# Patient Record
Sex: Female | Born: 1965 | Race: Asian | Hispanic: No | Marital: Married | Smoking: Never smoker
Health system: Southern US, Community
[De-identification: ages and names within clinical notes are randomized; demographics above are authoritative.]

## PROBLEM LIST (undated history)

## (undated) DIAGNOSIS — J869 Pyothorax without fistula: Secondary | ICD-10-CM

## (undated) HISTORY — DX: Pyothorax without fistula: J86.9

---

## 1999-12-02 ENCOUNTER — Ambulatory Visit (HOSPITAL_COMMUNITY): Admission: RE | Admit: 1999-12-02 | Discharge: 1999-12-02 | Payer: Self-pay | Admitting: Family Medicine

## 1999-12-02 ENCOUNTER — Encounter: Payer: Self-pay | Admitting: Family Medicine

## 2014-01-13 ENCOUNTER — Ambulatory Visit (INDEPENDENT_AMBULATORY_CARE_PROVIDER_SITE_OTHER): Payer: BC Managed Care – PPO | Admitting: Internal Medicine

## 2014-01-13 VITALS — BP 122/80 | HR 67 | Temp 100.7°F | Resp 16 | Ht <= 58 in | Wt 115.0 lb

## 2014-01-13 DIAGNOSIS — Z23 Encounter for immunization: Secondary | ICD-10-CM

## 2014-01-13 DIAGNOSIS — R8281 Pyuria: Secondary | ICD-10-CM

## 2014-01-13 DIAGNOSIS — Z0000 Encounter for general adult medical examination without abnormal findings: Secondary | ICD-10-CM

## 2014-01-13 DIAGNOSIS — N390 Urinary tract infection, site not specified: Secondary | ICD-10-CM

## 2014-01-13 LAB — LIPID PANEL
Cholesterol: 187 mg/dL (ref 0–200)
HDL: 65 mg/dL (ref >39)
LDL Cholesterol: 109 mg/dL — ABNORMAL HIGH (ref 0–99)
Total CHOL/HDL Ratio: 2.9 Ratio
Triglycerides: 64 mg/dL (ref <150)
VLDL: 13 mg/dL (ref 0–40)

## 2014-01-13 LAB — POCT WET PREP WITH KOH
Clue Cells Wet Prep HPF POC: NEGATIVE
KOH Prep POC: NEGATIVE
RBC Wet Prep HPF POC: NEGATIVE
Trichomonas, UA: NEGATIVE
Yeast Wet Prep HPF POC: NEGATIVE

## 2014-01-13 LAB — POCT CBC
Granulocyte percent: 47.4 %G (ref 37–80)
HCT, POC: 41.5 % (ref 37.7–47.9)
Hemoglobin: 13.4 g/dL (ref 12.2–16.2)
Lymph, poc: 2.6 (ref 0.6–3.4)
MCH, POC: 29.2 pg (ref 27–31.2)
MCHC: 32.3 g/dL (ref 31.8–35.4)
MCV: 90.4 fL (ref 80–97)
MID (cbc): 0.5 (ref 0–0.9)
MPV: 5.5 fL (ref 0–99.8)
POC Granulocyte: 2.8 (ref 2–6.9)
POC LYMPH PERCENT: 44.9 %L (ref 10–50)
POC MID %: 7.7 %M (ref 0–12)
Platelet Count, POC: 298 10*3/uL (ref 142–424)
RBC: 4.59 M/uL (ref 4.04–5.48)
RDW, POC: 13.7 %
WBC: 5.9 10*3/uL (ref 4.6–10.2)

## 2014-01-13 LAB — COMPLETE METABOLIC PANEL WITH GFR
ALT: 9 U/L (ref 0–35)
AST: 10 U/L (ref 0–37)
Albumin: 4.1 g/dL (ref 3.5–5.2)
Alkaline Phosphatase: 56 U/L (ref 39–117)
BUN: 15 mg/dL (ref 6–23)
CO2: 28 mEq/L (ref 19–32)
Calcium: 8.9 mg/dL (ref 8.4–10.5)
Chloride: 104 mEq/L (ref 96–112)
Creat: 0.57 mg/dL (ref 0.50–1.10)
GFR, Est African American: >89 mL/min
GFR, Est Non African American: >89 mL/min
Glucose, Bld: 85 mg/dL (ref 70–99)
Potassium: 3.8 mEq/L (ref 3.5–5.3)
Sodium: 139 mEq/L (ref 135–145)
Total Bilirubin: 0.7 mg/dL (ref 0.2–1.2)
Total Protein: 7 g/dL (ref 6.0–8.3)

## 2014-01-13 LAB — POCT URINALYSIS DIPSTICK
Bilirubin, UA: NEGATIVE
Blood, UA: NEGATIVE
Glucose, UA: NEGATIVE
Ketones, UA: NEGATIVE
Nitrite, UA: NEGATIVE
Protein, UA: NEGATIVE
Spec Grav, UA: 1.02
Urobilinogen, UA: 0.2
pH, UA: 6.5

## 2014-01-13 LAB — POCT UA - MICROSCOPIC ONLY
Casts, Ur, LPF, POC: NEGATIVE
Crystals, Ur, HPF, POC: NEGATIVE
Mucus, UA: NEGATIVE
RBC, urine, microscopic: NEGATIVE
Yeast, UA: NEGATIVE

## 2014-01-13 LAB — POCT URINE PREGNANCY: Preg Test, Ur: NEGATIVE

## 2014-01-13 NOTE — Progress Notes (Signed)
Subjective:    Patient ID: Sara Li, female    DOB: 10/13/1965, 48 y.o.   MRN: [id number] This chart was scribed for Ellamae Siaobert Jory Welke, MD by Jolene Provostobert Halas, Medical Scribe. This patient was seen in Room 8 and the patient's care was started a 9:16 AM. Translator present-family member.   HPI HPI Comments: Sara Li is a 48 y.o. female who [facility].  On triage pt had a measured fever of 100.7. Pt endorses associated HA. Pt denies dysuria, cough, or sore throat.  Pt denies family hx of breast cancer. Never mammo.Patient's last menstrual period was 11/29/2013. 3d lateo hx of pap smear on chart.  Pt would like a pregnancy test.LMP 3 d late  immun thoght to be UTD  Review of Systems  Constitutional: Positive for fever.  All other systems reviewed and are negative. see form     Objective:   Physical Exam  Constitutional: She is oriented to person, place, and time. She appears well-developed and well-nourished. No distress.  HENT:  Head: Normocephalic and atraumatic.  Right Ear: External ear normal.  Left Ear: External ear normal.  Nose: Nose normal.  Mouth/Throat: Oropharynx is clear and moist.  Eyes: Conjunctivae and EOM are normal. Pupils are equal, round, and reactive to light.  Neck: Normal range of motion. Neck supple. No thyromegaly present.  Cardiovascular: Normal rate, regular rhythm, normal heart sounds and intact distal pulses.   No murmur heard. Pulmonary/Chest: Effort normal and breath sounds normal. No respiratory distress. She has no wheezes.  Abdominal: Soft. Bowel sounds are normal. She exhibits no distension and no mass. There is no hepatosplenomegaly. There is no tenderness. There is no rebound. Hernia confirmed negative in the right inguinal area and confirmed negative in the left inguinal area.  Genitourinary: Uterus normal. There is no rash on the right labia. There is no rash on the left labia. Uterus is not tender. Cervix exhibits  discharge. Cervix exhibits no friability. Right adnexum displays no mass, no tenderness and no fullness. Left adnexum displays no mass, no tenderness and no fullness. No erythema, tenderness or bleeding in the vagina. Vaginal discharge found.  Breast exam normal.  Musculoskeletal: Normal range of motion. She exhibits no edema or tenderness.  Lymphadenopathy:    She has no cervical adenopathy.       Right: No inguinal adenopathy present.       Left: No inguinal adenopathy present.  Neurological: She is alert and oriented to person, place, and time. She has normal reflexes. No cranial nerve deficit.  Skin: Skin is warm and dry. No rash noted.  Psychiatric: She has a normal mood and affect. Her behavior is normal. Judgment and thought content normal.  Nursing note and vitals reviewed.  Results for orders placed or performed in visit on 01/13/14  COMPLETE METABOLIC PANEL WITH GFR  Result Value Ref Range   Sodium 139 135 - 145 mEq/L   Potassium 3.8 3.5 - 5.3 mEq/L   Chloride 104 96 - 112 mEq/L   CO2 28 19 - 32 mEq/L   Glucose, Bld 85 70 - 99 mg/dL   BUN 15 6 - 23 mg/dL   Creat 4.090.57 8.110.50 - 9.141.10 mg/dL   Total Bilirubin 0.7 0.2 - 1.2 mg/dL   Alkaline Phosphatase 56 39 - 117 U/L   AST 10 0 - 37 U/L   ALT 9 0 - 35 U/L   Total Protein 7.0 6.0 - 8.3 g/dL   Albumin  4.1 3.5 - 5.2 g/dL   Calcium 8.9 8.4 - 40.910.5 mg/dL   GFR, Est African American >89 mL/min   GFR, Est Non African American >89 mL/min  Lipid panel  Result Value Ref Range   Cholesterol 187 0 - 200 mg/dL   Triglycerides 64 <811<150 mg/dL   HDL 65 >91>39 mg/dL   Total CHOL/HDL Ratio 2.9 Ratio   VLDL 13 0 - 40 mg/dL   LDL Cholesterol 478109 (H) 0 - 99 mg/dL  POCT CBC  Result Value Ref Range   WBC 5.9 4.6 - 10.2 K/uL   Lymph, poc 2.6 0.6 - 3.4   POC LYMPH PERCENT 44.9 10 - 50 %L   MID (cbc) 0.5 0 - 0.9   POC MID % 7.7 0 - 12 %M   POC Granulocyte 2.8 2 - 6.9   Granulocyte percent 47.4 37 - 80 %G   RBC 4.59 4.04 - 5.48 M/uL   Hemoglobin  13.4 12.2 - 16.2 g/dL   HCT, POC 29.541.5 62.137.7 - 47.9 %   MCV 90.4 80 - 97 fL   MCH, POC 29.2 27 - 31.2 pg   MCHC 32.3 31.8 - 35.4 g/dL   RDW, POC 30.813.7 %   Platelet Count, POC 298 142 - 424 K/uL   MPV 5.5 0 - 99.8 fL  POCT UA - Microscopic Only  Result Value Ref Range   WBC, Ur, HPF, POC 6-15    RBC, urine, microscopic neg    Bacteria, U Microscopic trace    Mucus, UA neg    Epithelial cells, urine per micros TNTC    Crystals, Ur, HPF, POC neg    Casts, Ur, LPF, POC neg    Yeast, UA neg   POCT urinalysis dipstick  Result Value Ref Range   Color, UA yellow    Clarity, UA clear    Glucose, UA neg    Bilirubin, UA neg    Ketones, UA neg    Spec Grav, UA 1.020    Blood, UA neg    pH, UA 6.5    Protein, UA neg    Urobilinogen, UA 0.2    Nitrite, UA neg    Leukocytes, UA small (1+)   POCT urine pregnancy  Result Value Ref Range   Preg Test, Ur Negative   POCT Wet Prep with KOH  Result Value Ref Range   Trichomonas, UA Negative    Clue Cells Wet Prep HPF POC neg    Epithelial Wet Prep HPF POC 0-6    Yeast Wet Prep HPF POC neg    Bacteria Wet Prep HPF POC 1+    RBC Wet Prep HPF POC neg    WBC Wet Prep HPF POC 20-30    KOH Prep POC Negative         Assessment & Plan:   I have completed the patient encounter in its entirety as documented by the scribe, with editing by me where necessary. Lum Stillinger P. Merla Richesoolittle, M.D.   Annual physical exam - Plan: Pap IG, CT/NG NAA, and HPV (high risk),   MM Digital Screening/rout labs  Pyuria - Plan: Urine culture  Need for prophylactic vaccination and inoculation against influenza - Plan: Flu Vaccine QUAD 36+ mos IM   Call w/ results

## 2014-01-14 LAB — URINE CULTURE
Colony Count: NO GROWTH
Organism ID, Bacteria: NO GROWTH

## 2014-01-15 LAB — PAP IG, CT-NG NAA, HPV HIGH-RISK
Chlamydia Probe Amp: NEGATIVE
GC Probe Amp: NEGATIVE
HPV DNA High Risk: NOT DETECTED

## 2014-01-21 ENCOUNTER — Other Ambulatory Visit: Payer: Self-pay | Admitting: Internal Medicine

## 2014-01-21 DIAGNOSIS — Z1231 Encounter for screening mammogram for malignant neoplasm of breast: Secondary | ICD-10-CM

## 2015-04-06 ENCOUNTER — Ambulatory Visit (INDEPENDENT_AMBULATORY_CARE_PROVIDER_SITE_OTHER): Payer: BLUE CROSS/BLUE SHIELD | Admitting: Family Medicine

## 2015-04-06 ENCOUNTER — Ambulatory Visit (INDEPENDENT_AMBULATORY_CARE_PROVIDER_SITE_OTHER): Payer: BLUE CROSS/BLUE SHIELD

## 2015-04-06 ENCOUNTER — Ambulatory Visit (HOSPITAL_BASED_OUTPATIENT_CLINIC_OR_DEPARTMENT_OTHER)
Admission: RE | Admit: 2015-04-06 | Discharge: 2015-04-06 | Disposition: A | Discharge status: Home / Self Care | Payer: BLUE CROSS/BLUE SHIELD | Source: Ambulatory Visit | Attending: Physician Assistant | Admitting: Physician Assistant

## 2015-04-06 ENCOUNTER — Encounter (HOSPITAL_BASED_OUTPATIENT_CLINIC_OR_DEPARTMENT_OTHER): Payer: Self-pay | Admitting: *Deleted

## 2015-04-06 ENCOUNTER — Emergency Department (HOSPITAL_BASED_OUTPATIENT_CLINIC_OR_DEPARTMENT_OTHER)
Admission: EM | Admit: 2015-04-06 | Discharge: 2015-04-06 | Disposition: A | Discharge status: Home / Self Care | Payer: BLUE CROSS/BLUE SHIELD | Attending: Emergency Medicine | Admitting: Emergency Medicine

## 2015-04-06 ENCOUNTER — Encounter (HOSPITAL_BASED_OUTPATIENT_CLINIC_OR_DEPARTMENT_OTHER): Payer: Self-pay

## 2015-04-06 ENCOUNTER — Other Ambulatory Visit: Payer: Self-pay

## 2015-04-06 VITALS — BP 122/78 | HR 90 | Temp 99.1°F | Resp 16 | Ht <= 58 in | Wt 115.0 lb

## 2015-04-06 DIAGNOSIS — J90 Pleural effusion, not elsewhere classified: Secondary | ICD-10-CM

## 2015-04-06 DIAGNOSIS — R079 Chest pain, unspecified: Secondary | ICD-10-CM

## 2015-04-06 DIAGNOSIS — M549 Dorsalgia, unspecified: Secondary | ICD-10-CM | POA: Diagnosis not present

## 2015-04-06 DIAGNOSIS — J069 Acute upper respiratory infection, unspecified: Secondary | ICD-10-CM | POA: Insufficient documentation

## 2015-04-06 DIAGNOSIS — J9811 Atelectasis: Secondary | ICD-10-CM | POA: Insufficient documentation

## 2015-04-06 DIAGNOSIS — Z3202 Encounter for pregnancy test, result negative: Secondary | ICD-10-CM

## 2015-04-06 LAB — CBC
HEMATOCRIT: 40.9 % (ref 36.0–46.0)
HEMOGLOBIN: 13.1 g/dL (ref 12.0–15.0)
MCH: 28.1 pg (ref 26.0–34.0)
MCHC: 32 g/dL (ref 30.0–36.0)
MCV: 87.8 fL (ref 78.0–100.0)
Platelets: 402 10*3/uL — ABNORMAL HIGH (ref 150–400)
RBC: 4.66 MIL/uL (ref 3.87–5.11)
RDW: 12.5 % (ref 11.5–15.5)
WBC: 13 10*3/uL — ABNORMAL HIGH (ref 4.0–10.5)

## 2015-04-06 LAB — BASIC METABOLIC PANEL
Anion gap: 10 (ref 5–15)
BUN: 15 mg/dL (ref 6–20)
CO2: 25 mmol/L (ref 22–32)
Calcium: 8.8 mg/dL — ABNORMAL LOW (ref 8.9–10.3)
Chloride: 102 mmol/L (ref 101–111)
Creatinine, Ser: 0.55 mg/dL (ref 0.44–1.00)
GFR calc Af Amer: >60 mL/min (ref >60)
GLUCOSE: 108 mg/dL — AB (ref 65–99)
POTASSIUM: 3.9 mmol/L (ref 3.5–5.1)
Sodium: 137 mmol/L (ref 135–145)

## 2015-04-06 LAB — TROPONIN I: Troponin I: <0.03 ng/mL (ref <0.031)

## 2015-04-06 LAB — POCT URINE PREGNANCY: Preg Test, Ur: NEGATIVE

## 2015-04-06 MED ORDER — KETOROLAC TROMETHAMINE 30 MG/ML IJ SOLN
30.0000 mg | Freq: Once | INTRAMUSCULAR | Status: AC
  Administered 2015-04-06: 30 mg via INTRAMUSCULAR
  Filled 2015-04-06: qty 1

## 2015-04-06 MED ORDER — IOHEXOL 350 MG/ML SOLN
100.0000 mL | Freq: Once | INTRAVENOUS | Status: AC | PRN
  Administered 2015-04-06: 100 mL via INTRAVENOUS

## 2015-04-06 MED ORDER — TRAMADOL HCL 50 MG PO TABS: 50.0000 mg | ORAL_TABLET | Freq: Four times a day (QID) | ORAL | Status: DC | PRN

## 2015-04-06 NOTE — ED Provider Notes (Signed)
CSN: [id number]     Arrival date & time 04/06/15  1608 History  By signing my name below, I, Sara Li, attest that this documentation has been prepared under the direction and in the presence of Benjiman Core, MD. Electronically Signed: Bethel Li, ED Scribe. 04/06/2015. 5:21 PM   Chief Complaint  Patient presents with  . Chest Pain   The history is provided by the patient. A language interpreter was used.   Sara Li is a 50 y.o. female who presents to the Emergency Department complaining of constant, worsening, 10/10 in severity, right upper back pain that radiates into the chest with onset 10 days ago. The pain is exacerbated by breathing. Today during an outpatient CT scan the pain under her right breast significantly worsened.  Associated symptoms include chills. Pt denies fever, cough, hemoptysis, and weight loss. She has no PCP. Pt denies smoking.   A relative is at bedside translating.    History reviewed. No pertinent past medical history. History reviewed. No pertinent past surgical history. History reviewed. No pertinent family history. Social History  Substance Use Topics  . Smoking status: Never Smoker   . Smokeless tobacco: None  . Alcohol Use: No   OB History    No data available     Review of Systems  Constitutional: Positive for chills. Negative for fever.  Respiratory: Negative for cough.   Cardiovascular: Positive for chest pain.  Musculoskeletal: Positive for back pain.  All other systems reviewed and are negative.  Allergies  Review of patient's allergies indicates no known allergies.  Home Medications   Prior to Admission medications   Medication Sig Start Date End Date Taking? Authorizing Provider  traMADol (ULTRAM) 50 MG tablet Take 1 tablet (50 mg total) by mouth every 6 (six) hours as needed. 04/06/15   Benjiman Core, MD   BP 124/84 mmHg  Pulse 108  Temp(Src) 99.9 F (37.7 C) (Oral)  Resp 18  Ht  (1.549 m)  Wt 116 lb  (52.617 kg)  BMI 21.93 kg/m2  SpO2 98%  LMP 04/06/2015 Physical Exam  Constitutional: She is oriented to person, place, and time. She appears well-developed and well-nourished. No distress.  Pt appears uncomfortable  HENT:  Head: Normocephalic and atraumatic.  Eyes: EOM are normal.  Neck: Normal range of motion.  Cardiovascular: Normal rate, regular rhythm and normal heart sounds.   Pulmonary/Chest: Effort normal and breath sounds normal.  Abdominal: Soft. She exhibits no distension. There is no tenderness.  Musculoskeletal: Normal range of motion.  Neurological: She is alert and oriented to person, place, and time.  Skin: Skin is warm and dry.  Psychiatric: She has a normal mood and affect. Judgment normal.  Nursing note and vitals reviewed.   ED Course  Procedures (including critical care time) COORDINATION OF CARE: 5:13 PM Discussed treatment plan which includes lab work and pain management with pt at bedside and pt agreed to plan.  Labs Review Labs Reviewed  BASIC METABOLIC PANEL - Abnormal; Notable for the following:    Glucose, Bld 108 (*)    Calcium 8.8 (*)    All other components within normal limits  CBC - Abnormal; Notable for the following:    WBC 13.0 (*)    Platelets 402 (*)    All other components within normal limits  TROPONIN I    Imaging Review Dg Chest 2 View  04/06/2015  CLINICAL DATA:  50 year old female with atypical right side chest and mid thoracic back pain with  illness for 10 days. Initial encounter. EXAM: CHEST  2 VIEW COMPARISON:  None. FINDINGS: Small to moderate bilateral pleural effusions, small to moderate right pleural effusion. Dense associated right lung base opacification. Mildly low lung volumes superimposed, mild crowding at the left lung base which otherwise is clear. No pneumothorax or pleural effusion. Normal cardiac size and mediastinal contours. Visualized tracheal air column is within normal limits. No osseous abnormality identified.  IMPRESSION: Small to moderate right pleural effusion. No other acute cardiopulmonary abnormality. Electronically Signed   By: Odessa Fleming M.D.   On: 04/06/2015 13:23   Ct Angio Chest W/cm &/or Wo Cm  04/06/2015  CLINICAL DATA:  Chest pain.  Rule out pulmonary embolism EXAM: CT ANGIOGRAPHY CHEST WITH CONTRAST TECHNIQUE: Multidetector CT imaging of the chest was performed using the standard protocol during bolus administration of intravenous contrast. Multiplanar CT image reconstructions and MIPs were obtained to evaluate the vascular anatomy. CONTRAST:  OMNIPAQUE IOHEXOL 350 MG/ML SOLN COMPARISON:  None. FINDINGS: Negative for pulmonary embolism. Pulmonary arteries normal in caliber. Heart size normal. No pericardial effusion. Thoracic aorta appears normal. No dissection or aneurysm. Elevated right hemidiaphragm. Small right pleural effusion. Right lower lobe atelectasis. No effusion on the left. No significant infiltrate on the left. Negative for mass or adenopathy. Elevated right hemidiaphragm with fatty infiltration of the liver. No acute abnormality upper abdomen. Review of the MIP images confirms the above findings. IMPRESSION: Negative for pulmonary embolism Small right effusion and dependent atelectasis right lower lobe with elevation of the right hemidiaphragm. Electronically Signed   By: Marlan Palau M.D.   On: 04/06/2015 16:22   I have personally reviewed and evaluated these images and lab results as part of my medical decision-making.   EKG Interpretation None      Date: 04/07/2015  Rate: 99  Rhythm: normal sinus rhythm  QRS Axis: normal  Intervals: normal  ST/T Wave abnormalities: normal  Conduction Disutrbances: none  Narrative Interpretation: unremarkable    MDM   Final diagnoses:  URI (upper respiratory infection)  Pleural effusion    Patient with chest pain. CT scan [place] and reassuring. Did have pleural effusion. Will need follow-up to assure clearance and no  underlying malignancy. No PE. Doubt cardiac cause. Will discharge home.  I personally performed the services described in this documentation, which was scribed in my presence. The recorded information has been reviewed and is accurate.      Benjiman Core, MD 04/07/15 [phone]

## 2015-04-06 NOTE — Progress Notes (Signed)
CT Angio chest ordered per Dr. Loma Boston VO.  Deliah Boston, MS, PA-C 1:24 PM, 04/06/2015

## 2015-04-06 NOTE — Progress Notes (Addendum)
By signing my name below, I, Stann Ore, attest that this documentation has been prepared under the direction and in the presence of Elvina Sidle, MD. Electronically Signed: Stann Ore, Scribe. 04/06/2015 , 12:53 PM .  Patient was seen in room 11 .   Patient ID: Sara Li MRN: [id number], DOB: September 28, 1965, 50 y.o. Date of Encounter: 04/06/2015  Primary Physician: No PCP Per Patient  Chief Complaint:  Chief Complaint  Patient presents with  . Right side and upper back pain x 10 days    especially with deep breaths    HPI:  Sara Li is a 50 y.o. female who presents to [facility] complaining of right upper back pain that started 10 days ago. Pt states that she had a cold with fever last week. She took a shower and felt pain in her right upper back. She notes that it hurts more in the evening. She's taken tylenol for some relief. She denies pain in her calves.   Patient primarily speaks [place] Her friend is here to translate for her in the room.  She does packing work.   No past medical history on file.   Home Meds: Prior to Admission medications   Not on File    Allergies: No Known Allergies  Social History   Social History  . Marital Status: Married    Spouse Name: N/A  . Number of Children: N/A  . Years of Education: N/A   Occupational History  . Not on file.   Social History Main Topics  . Smoking status: Never Smoker   . Smokeless tobacco: Not on file  . Alcohol Use: No  . Drug Use: No  . Sexual Activity: Not on file   Other Topics Concern  . Not on file   Social History Narrative     Review of Systems: Constitutional: negative for fever, chills, night sweats, weight changes, or fatigue  HEENT: negative for vision changes, hearing loss, congestion, rhinorrhea, ST, epistaxis, or sinus pressure Cardiovascular: negative for chest pain or palpitations Respiratory: negative for hemoptysis, wheezing, shortness of breath,  or cough Abdominal: negative for abdominal pain, nausea, vomiting, diarrhea, or constipation Dermatological: negative for rash Musc: positive for back pain (right upper)  Neurologic: negative for headache, dizziness, or syncope All other systems reviewed and are otherwise negative with the exception to those above and in the HPI.  Physical Exam: Blood pressure 122/78, pulse 90, temperature 99.1 F (37.3 C), temperature source Oral, resp. rate 16, height  (1.473 m), weight 115 lb (52.164 kg), SpO2 94 %., Body mass index is 24.04 kg/(m^2). General: Well developed, well nourished, in no acute distress. Head: Normocephalic, atraumatic, eyes without discharge, sclera non-icteric, nares are without discharge. Bilateral auditory canals clear, TM's are without perforation, pearly grey and translucent with reflective cone of light bilaterally. Oral cavity moist, posterior pharynx without exudate, erythema, peritonsillar abscess, or post nasal drip.  Neck: Supple. No thyromegaly. Full ROM. No lymphadenopathy. Lungs: Clear bilaterally to auscultation without wheezes, rales, or rhonchi. Breathing is unlabored. Heart: RRR with S1 S2. No murmurs, rubs, or gallops appreciated. Msk:  Strength and tone normal for age. Extremities/Skin: Warm and dry. No clubbing or cyanosis. No edema. No rashes or suspicious lesions. Neuro: Alert and oriented X 3. Moves all extremities spontaneously. Gait is normal. CNII-XII grossly in tact. Psych:  Responds to questions appropriately with a normal affect.   [facility] reading (PRIMARY) by Dr. Milus Glazier : chest xray: what appears to  be a fluid in the right lung (pleural effusion)  Labs: Results for orders placed or performed in visit on 01/13/14  Urine culture  Result Value Ref Range   Colony Count NO GROWTH    Organism ID, Bacteria NO GROWTH   COMPLETE METABOLIC PANEL WITH GFR  Result Value Ref Range   Sodium 139 135 - 145 mEq/L   Potassium 3.8 3.5 - 5.3 mEq/L    Chloride 104 96 - 112 mEq/L   CO2 28 19 - 32 mEq/L   Glucose, Bld 85 70 - 99 mg/dL   BUN 15 6 - 23 mg/dL   Creat 1.61 0.96 - 0.45 mg/dL   Total Bilirubin 0.7 0.2 - 1.2 mg/dL   Alkaline Phosphatase 56 39 - 117 U/L   AST 10 0 - 37 U/L   ALT 9 0 - 35 U/L   Total Protein 7.0 6.0 - 8.3 g/dL   Albumin 4.1 3.5 - 5.2 g/dL   Calcium 8.9 8.4 - 40.9 mg/dL   GFR, Est African American >89 mL/min   GFR, Est Non African American >89 mL/min  Lipid panel  Result Value Ref Range   Cholesterol 187 0 - 200 mg/dL   Triglycerides 64 <811 mg/dL   HDL 65 >91 mg/dL   Total CHOL/HDL Ratio 2.9 Ratio   VLDL 13 0 - 40 mg/dL   LDL Cholesterol 478 (H) 0 - 99 mg/dL  POCT CBC  Result Value Ref Range   WBC 5.9 4.6 - 10.2 K/uL   Lymph, poc 2.6 0.6 - 3.4   POC LYMPH PERCENT 44.9 10 - 50 %L   MID (cbc) 0.5 0 - 0.9   POC MID % 7.7 0 - 12 %M   POC Granulocyte 2.8 2 - 6.9   Granulocyte percent 47.4 37 - 80 %G   RBC 4.59 4.04 - 5.48 M/uL   Hemoglobin 13.4 12.2 - 16.2 g/dL   HCT, POC 29.5 62.1 - 47.9 %   MCV 90.4 80 - 97 fL   MCH, POC 29.2 27 - 31.2 pg   MCHC 32.3 31.8 - 35.4 g/dL   RDW, POC 30.8 %   Platelet Count, POC 298 142 - 424 K/uL   MPV 5.5 0 - 99.8 fL  POCT UA - Microscopic Only  Result Value Ref Range   WBC, Ur, HPF, POC 6-15    RBC, urine, microscopic neg    Bacteria, U Microscopic trace    Mucus, UA neg    Epithelial cells, urine per micros TNTC    Crystals, Ur, HPF, POC neg    Casts, Ur, LPF, POC neg    Yeast, UA neg   POCT urinalysis dipstick  Result Value Ref Range   Color, UA yellow    Clarity, UA clear    Glucose, UA neg    Bilirubin, UA neg    Ketones, UA neg    Spec Grav, UA 1.020    Blood, UA neg    pH, UA 6.5    Protein, UA neg    Urobilinogen, UA 0.2    Nitrite, UA neg    Leukocytes, UA small (1+)   POCT urine pregnancy  Result Value Ref Range   Preg Test, Ur Negative   POCT Wet Prep with KOH  Result Value Ref Range   Trichomonas, UA Negative    Clue Cells Wet Prep  HPF POC neg    Epithelial Wet Prep HPF POC 0-6    Yeast Wet Prep HPF POC neg  Bacteria Wet Prep HPF POC 1+    RBC Wet Prep HPF POC neg    WBC Wet Prep HPF POC 20-30    KOH Prep POC Negative   Pap IG, CT/NG NAA, and HPV (high risk)  Result Value Ref Range   HPV DNA High Risk Not Detected    Specimen adequacy: SEE NOTE    FINAL DIAGNOSIS: SEE NOTE    Cytotechnologist: SEE NOTE    Chlamydia Probe Amp NEGATIVE    GC Probe Amp NEGATIVE      ASSESSMENT AND PLAN:  50 y.o. year old female with significant new pleural effusion with atypical chest pain.  We will proceed with a CT angiogram of the chest. I explained this to the patient and we prefer to have [facility] go to [facility] to get her in and out quickly    Signed, Elvina Sidle, MD 04/06/2015 1:15 PM    Patient developed excruciating pain before I received the CT report. I directed them to the emergency department at [facility]. Prograf signed, Sheila Oats.D.

## 2015-04-06 NOTE — ED Notes (Addendum)
Left upper flank pain radiating into the back that started 10 days ago.  Pt went to urgent care to be evaluated, was sent to [facility] for an outpatient CT scan.  After pt got contrast dye for CT, pt started having pain under her right breast.  Denies N/V.

## 2015-04-06 NOTE — Discharge Instructions (Signed)
Nhi?m trng ???ng h h?p trn, Ng??i l?n (Upper Respiratory Infection, Adult) H?u h?t nhi?m trng ???ng h h?p trn (URI) l b?nh nhi?m do vi rt ? ???ng d?n kh ??n ph?i. URI ?nh h??ng ??n m?i, h?ng v ???ng d?n kh trn. Lo?i URI ph? bi?n nh?t l vim m?i h?ng v th??ng ???c cho l "c?m l?nh thng th??ng". URI ti?n tri?n v th??ng s? t? kh?i. Thng th??ng URI khng c?n ?i?u tr?, nh?ng ?i khi ti?p theo nhi?m vi rt l nhi?m khu?n ???ng h h?p trn. Tnh tr?ng ny g?i l nhi?m trng th? pht. Nhi?m trng xoang v tai gi?a l nh?ng lo?i nhi?m trng h h?p trn th? pht ph? bi?n nh?t. Vim ph?i do vi khu?n c?ng c th? lm cho URI tr?m tr?ng h?n. URI c th? lm cho b?nh hen suy?n v b?nh ph?i t?c ngh?n m?n tnh (COPD) tr?m tr?ng h?n. ?i khi, nh?ng bi?n ch?ng ny c th? c?n ph?i ???c ?i?u tr? c?p c?u n?i khoa v c th? ?e d?a tnh m?ng.  NGUYN NHN H?u h?t t?t c? cc tr??ng h?p URI ??u do vi rt gy ra. Vi rt l m?t lo?i m?m b?nh v c th? ly lan t? ng??i ny sang ng??i khc.  CC Y?U T? NGUY C? Qu v? c th? c nguy c? b? URI n?u:   Qu v? ht thu?c l.  Qu v? b? b?nh tim ho?c b?nh ph?i m?n tnh.  Qu v? c h? th?ng phng v? (mi?n d?ch) suy y?u.  Qu v? r?t tr? ho?c r?t gi.  Qu v? b? d? ?ng ? m?i ho?c b? hen suy?n.  Qu v? lm vi?c trong cc khu v?c ?ng ?c ho?c thng kh km.  Qu v? lm vi?c trong cc trung tm ch?m [place] s?c kh?e ho?c tr??ng h?c. D?U HI?U V TRI?U CH?NG  Cc tri?u ch?ng th??ng pht tri?n sau khi qu v? ti?p xc v?i vi rt c?m l?nh 2-3 ngy. H?u h?t cc tr??ng h?p URI do vi rt ko di 7-10 ngy. Tuy nhin, cc tr??ng h?p URI do vi rt cm (vi rt cm) c th? ko di 14 - 18 ngy v th??ng n?ng h?n. Tri?u ch?ng c th? bao g?m:   Ch?y n??c m?i ho?c ng?t (ngh?t) m?i.  H?t h?i.  Ho.  ?au h?ng.  ?au ??u.  M?t m?i.  S?t.  ?n khng ngon mi?ng.  ?au ? trn, pha sau m?t v trn x??ng g m (?au xoang).  ?au nh?c c?. CH?N ?ON  Chuyn gia ch?m [place] s?c kh?e  c th? ch?n ?on URI b?ng cch:  Khm th?c th?.  Ki?m tra ?? kh?ng ??nh cc tri?u ch?ng khng ph?i l do m?t tnh tr?ng khc, ch?ng h?n nh?:  Vim h?ng do lin c?u khu?n.  Vim xoang.  Vim ph?i.  Hen suy?n. ?I?U TR?  URI t? kh?i sau m?t th?i gian. B?nh khng th? ch?a kh?i ???c b?ng thu?c, nh?ng thu?c c th? ???c k ??n v khuy?n ngh? dng ?? lm gi?m cc tri?u ch?ng. Thu?c c th? gip:  H? s?t.  Gi?m ho.  Gi?m ngh?t m?i. H??NG D?N CH?M [place] T?I NH   Ch? s? d?ng thu?c theo ch? d?n c?a chuyn gia ch?m [place] s?c kh?e.  Xc mi?ng b?ng n??c mu?i ?m ho?c dng thu?c gi?m ho ?? lm d?u h?ng qu v? theo ch? d?n c?a chuyn gia ch?m  s?c kh?e.  S? d?ng my t?o ?? ?m b?ng s??ng m ?m ho?c ht h?i n??c t? m?t vi  sen ?? t?ng ?? ?m khng kh. ?i?u ny c th? gip d? th? h?n.  U?ng ?? n??c ?? gi? cho n??c ti?u trong ho?c c mu vng nh?t.  ?n sp ho?c cc lo?i n??c canh trong khc v duy tr ch? ?? dinh d??ng t?t.  Ngh? ng?i khi c?n.  Tr? l?i lm vi?c khi nhi?t ?? c?a qu v? ? tr? l?i bnh th??ng ho?c theo chuyn gia ch?m [place] s?c kh?e h??ng d?n. Qu v? c th? c?n ? nh lu h?n ?? trnh ly nhi?m cho ng??i khc. Qu v? c?ng c th? s? d?ng m?t n? v r?a tay c?n th?n ?? ng?n ng?a ly lan vi rt.  T?ng c??ng s? d?ng ?ng thu?c ht n?u qu v? b? b?nh hen suy?n.  Khng s? d?ng b?t c? cc s?n ph?m thu?c l no, bao g?m thu?c l d?ng ht, thu?c l d?ng nhai ho?c thu?c l ?i?n t?. N?u qu v? c?n gip ?? ?? cai thu?c, hy h?i chuyn gia ch?m [place] s?c kh?e. PHNG NG?A  Cch t?t nh?t ?? b?o v? b?n thn kh?i b? c?m l?nh l th?c hnh v? sinh t?t.   Hessie Diener ti?p xc b?ng mi?ng ho?c b?ng tay v?i nh?ng ng??i c tri?u ch?ng c?m l?nh.  R?a tay th??ng xuyn n?u c ti?p xc. Khng c b?ng ch?ng r rng v? vi?c vitamin C, vitamin E, echinacea ho?c t?p th? d?c lm gi?m kh? n?ng b? c?m l?nh. Tuy nhin, qu v? lun c?n ngh? ng?i nhi?u, t?p th? d?c v c ch? ?? dinh d??ng t?t.  ?I KHM N?U:   Qu v? b? tr?m  tr?ng h?n ch? khng ?? h?n.  Tri?u ch?ng c?a qu v? khng ki?m sot ???c b?ng thu?c.  Qu v? b? ?n l?nh.  Qu v? b? kh th? h?n.  Qu v? c ??m mu nu ho?c ??Sander Nephew v? ti?t d?ch mu vng ho?c mu nu ? m?i.  Qu v? b? ?au ? m?t, ??c bi?t l khi qu v? ci v? pha tr??c.  Qu v? b? s?t.  Qu v? b? s?ng h?ch c?.  Qu v? th?y ?au khi nu?t.  Qu v? c nh?ng vng mu tr?ng ? thnh sau h?ng. NGAY L?P T?C ?I KHM N?U:   Qu v? b? n?ng v lin t?c:  ?au ??u.  ?au tai.  ?au xoang.  ?au ng?c.  Qu v? b? b?nh ph?i m?n tnh v b?t k? tnh tr?ng no sau ?y:  Th? kh kh.  Ho ko di.  Ho ra mu.  Thay ??i s? l??ng v mu s?c ??m thng th??ng c?a qu v?.  Qu v? b? c?ng c?.  Qu v? c nh?ng thay ??i v?:  Th? l?c.  Thnh l?c.  Suy ngh?.  Tm tnh. ??M B?O QU V?:   Hi?u r cc h??ng d?n ny.  S? theo di tnh tr?ng c?a mnh.  S? yu c?u tr? gip ngay l?p t?c n?u qu v? c?m th?y khng kh?e ho?c th?y tr?m tr?ng h?n.   Thng tin ny khng nh?m m?c ?ch thay th? cho l?i khuyn m chuyn gia ch?m [place] s?c kh?e ni v?i qu v?. Hy b?o ??m qu v? ph?i th?o lu?n b?t k? v?n ?? g m qu v? c v?i chuyn gia ch?m [place] s?c kh?e c?a qu v?.   Document Released: 08/31/2010 Document Revised: 07/02/2014 Elsevier Interactive Patient Education 2016 Elsevier Inc. Pleural Effusion A pleural effusion is an abnormal buildup of fluid in the layers of tissue between your lungs and  the inside of your chest (pleural space). These two layers of tissue that line both your lungs and the inside of your chest are called pleura. Usually, there is no air in the space between the pleura, only a thin layer of fluid. If left untreated, a large amount of fluid can build up and cause the lung to collapse. A pleural effusion is usually caused by another disease that requires treatment. The two main types of pleural effusion are:  Transudative pleural effusion. This happens when fluid leaks into the  pleural space because of a low protein count in your blood or high blood pressure in your vessels. Heart failure often causes this.  Exudative infusion. This occurs when fluid collects in the pleural space from blocked blood vessels or lymph vessels. Some lung diseases, injuries, and cancers can cause this type of effusion. CAUSES Pleural effusion can be caused by:  Heart failure.  A blood clot in the lung (pulmonary embolism).  Pneumonia.  Cancer.  Liver failure (cirrhosis).  Kidney disease.  Complications from surgery, such as from open heart surgery. SIGNS AND SYMPTOMS In some cases, pleural effusion may cause no symptoms. Symptoms can include:  Shortness of breath, especially when lying down.  Chest pain, often worse when taking a deep breath.  Fever.  Dry cough that is lasting (chronic).  Hiccups.  Rapid breathing. An underlying condition that is causing the pleural effusion (such as heart failure, pneumonia, blood clots, tuberculosis, or cancer) may also cause additional symptoms. DIAGNOSIS Your health care provider may suspect pleural effusion based on your symptoms and medical history. Your health care provider will also do a physical exam and a chest X-ray. If the X-ray shows there is fluid in your chest, you may need to have this fluid removed using a needle (thoracentesis) so it can be tested. You may also have:  Imaging studies of the chest, such as:  Ultrasound.  CT scan.  Blood tests for kidney and liver function. TREATMENT Treatment depends on the cause of the pleural effusion. Treatment may include:  Taking antibiotic medicines to clear up an infection that is causing the pleural effusion.  Placing a tube in the chest to drain the effusion (tube thoracostomy). This procedure is often used when there is an infection in the fluid.  Surgery to remove the fibrous outer layer of tissue from the pleural space (decortication).  Thoracentesis, which can  improve cough and shortness of breath.  A procedure to put medicine into the chest cavity to seal the pleural space to prevent fluid buildup (pleurodesis).  Chemotherapy and radiation therapy. These may be required in the case of cancerous (malignant) pleural effusion. HOME CARE INSTRUCTIONS  Take medicines only as directed by your health care provider.  Keep track of how long you can gently exercise before you get short of breath. Try simply walking at first.  Do not use any tobacco products, including cigarettes, chewing tobacco, or electronic cigarettes. If you need help quitting, ask your health care provider.  Keep all follow-up visits as directed by your health care provider. This is important. SEEK MEDICAL CARE IF:  The amount of time that you are able to exercise decreases or does not improve with time.  You have pain or signs of infection at the puncture site if you had thoracentesis. Watch for:  Drainage.  Redness.  Swelling.  You have a fever. SEEK IMMEDIATE MEDICAL CARE IF:  You are short of breath.  You develop chest pain.  You develop  a new cough. MAKE SURE YOU:  Understand these instructions.  Will watch your condition.  Will get help right away if you are not doing well or get worse.   This information is not intended to replace advice given to you by your health care provider. Make sure you discuss any questions you have with your health care provider.   Document Released: 02/15/2005 Document Revised: 03/08/2014 Document Reviewed: 07/11/2013 Elsevier Interactive Patient Education [employer].

## 2015-04-06 NOTE — ED Notes (Signed)
Dr. Rubin Payor wrote pt work note for three days, pt states that her work requires her to be out for four or more days if sick. Spoke with Dr. Rubin Payor who stated okay to write note for four days.

## 2015-04-06 NOTE — Patient Instructions (Addendum)
Please go to [facility] for your CT Scan     Because you received an x-ray today, you will receive an invoice from [facility]. Please contact [facility] at [phone] with questions or concerns regarding your invoice. Our billing staff will not be able to assist you with those questions.

## 2015-04-07 ENCOUNTER — Other Ambulatory Visit: Payer: Self-pay | Admitting: Family Medicine

## 2015-04-07 DIAGNOSIS — J90 Pleural effusion, not elsewhere classified: Secondary | ICD-10-CM

## 2015-04-07 DIAGNOSIS — R0789 Other chest pain: Secondary | ICD-10-CM

## 2015-04-10 ENCOUNTER — Inpatient Hospital Stay (HOSPITAL_COMMUNITY): Payer: BLUE CROSS/BLUE SHIELD | Admitting: Anesthesiology

## 2015-04-10 ENCOUNTER — Emergency Department (HOSPITAL_BASED_OUTPATIENT_CLINIC_OR_DEPARTMENT_OTHER): Payer: BLUE CROSS/BLUE SHIELD

## 2015-04-10 ENCOUNTER — Inpatient Hospital Stay (HOSPITAL_COMMUNITY): Payer: BLUE CROSS/BLUE SHIELD

## 2015-04-10 ENCOUNTER — Inpatient Hospital Stay (HOSPITAL_BASED_OUTPATIENT_CLINIC_OR_DEPARTMENT_OTHER)
Admission: EM | Admit: 2015-04-10 | Discharge: 2015-04-16 | DRG: 163 | Disposition: A | Discharge status: Home / Self Care | Payer: BLUE CROSS/BLUE SHIELD | Attending: Cardiothoracic Surgery | Admitting: Cardiothoracic Surgery

## 2015-04-10 ENCOUNTER — Encounter (HOSPITAL_COMMUNITY)
Admission: EM | Disposition: A | Discharge status: Home / Self Care | Payer: Self-pay | Source: Home / Self Care | Attending: Cardiothoracic Surgery

## 2015-04-10 ENCOUNTER — Encounter (HOSPITAL_BASED_OUTPATIENT_CLINIC_OR_DEPARTMENT_OTHER): Payer: Self-pay | Admitting: *Deleted

## 2015-04-10 DIAGNOSIS — J189 Pneumonia, unspecified organism: Secondary | ICD-10-CM | POA: Diagnosis present

## 2015-04-10 DIAGNOSIS — J9811 Atelectasis: Secondary | ICD-10-CM | POA: Diagnosis not present

## 2015-04-10 DIAGNOSIS — J939 Pneumothorax, unspecified: Secondary | ICD-10-CM | POA: Diagnosis not present

## 2015-04-10 DIAGNOSIS — J869 Pyothorax without fistula: Principal | ICD-10-CM | POA: Diagnosis present

## 2015-04-10 DIAGNOSIS — I471 Supraventricular tachycardia: Secondary | ICD-10-CM | POA: Diagnosis present

## 2015-04-10 DIAGNOSIS — R079 Chest pain, unspecified: Secondary | ICD-10-CM | POA: Diagnosis present

## 2015-04-10 DIAGNOSIS — Z4682 Encounter for fitting and adjustment of non-vascular catheter: Secondary | ICD-10-CM

## 2015-04-10 DIAGNOSIS — Z9689 Presence of other specified functional implants: Secondary | ICD-10-CM

## 2015-04-10 DIAGNOSIS — R0602 Shortness of breath: Secondary | ICD-10-CM

## 2015-04-10 DIAGNOSIS — J90 Pleural effusion, not elsewhere classified: Secondary | ICD-10-CM

## 2015-04-10 HISTORY — PX: VIDEO ASSISTED THORACOSCOPY (VATS)/EMPYEMA: SHX6172

## 2015-04-10 LAB — I-STAT CG4 LACTIC ACID, ED: LACTIC ACID, VENOUS: 1.05 mmol/L (ref 0.5–2.0)

## 2015-04-10 LAB — BLOOD GAS, ARTERIAL
Acid-Base Excess: 0.2 mmol/L (ref 0.0–2.0)
Bicarbonate: 23.8 mEq/L (ref 20.0–24.0)
Drawn by: 257081
FIO2: 0.21
O2 Saturation: 95.1 %
Patient temperature: 98.6
TCO2: 24.9 mmol/L (ref 0–100)
pCO2 arterial: 34.9 mmHg — ABNORMAL LOW (ref 35.0–45.0)
pH, Arterial: 7.449 (ref 7.350–7.450)
pO2, Arterial: 76.8 mmHg — ABNORMAL LOW (ref 80.0–100.0)

## 2015-04-10 LAB — CBC WITH DIFFERENTIAL/PLATELET
Basophils Absolute: 0 10*3/uL (ref 0.0–0.1)
Basophils Relative: 0 %
Eosinophils Absolute: 0 10*3/uL (ref 0.0–0.7)
Eosinophils Relative: 0 %
HEMATOCRIT: 41.5 % (ref 36.0–46.0)
HEMOGLOBIN: 13.6 g/dL (ref 12.0–15.0)
LYMPHS ABS: 1.1 10*3/uL (ref 0.7–4.0)
Lymphocytes Relative: 7 %
MCH: 28 pg (ref 26.0–34.0)
MCHC: 32.8 g/dL (ref 30.0–36.0)
MCV: 85.4 fL (ref 78.0–100.0)
MONO ABS: 1 10*3/uL (ref 0.1–1.0)
MONOS PCT: 6 %
NEUTROS ABS: 13.8 10*3/uL — AB (ref 1.7–7.7)
NEUTROS PCT: 87 %
Platelets: 440 10*3/uL — ABNORMAL HIGH (ref 150–400)
RBC: 4.86 MIL/uL (ref 3.87–5.11)
RDW: 12.8 % (ref 11.5–15.5)
WBC: 15.9 10*3/uL — ABNORMAL HIGH (ref 4.0–10.5)

## 2015-04-10 LAB — COMPREHENSIVE METABOLIC PANEL
ALT: 227 U/L — ABNORMAL HIGH (ref 14–54)
AST: 87 U/L — ABNORMAL HIGH (ref 15–41)
Albumin: 2.1 g/dL — ABNORMAL LOW (ref 3.5–5.0)
Alkaline Phosphatase: 182 U/L — ABNORMAL HIGH (ref 38–126)
Anion gap: 10 (ref 5–15)
BUN: 10 mg/dL (ref 6–20)
CO2: 23 mmol/L (ref 22–32)
Calcium: 7.7 mg/dL — ABNORMAL LOW (ref 8.9–10.3)
Chloride: 109 mmol/L (ref 101–111)
Creatinine, Ser: 0.5 mg/dL (ref 0.44–1.00)
GFR calc Af Amer: >60 mL/min (ref >60)
GFR calc non Af Amer: >60 mL/min (ref >60)
Glucose, Bld: 112 mg/dL — ABNORMAL HIGH (ref 65–99)
Potassium: 3.9 mmol/L (ref 3.5–5.1)
Sodium: 142 mmol/L (ref 135–145)
Total Bilirubin: 1.1 mg/dL (ref 0.3–1.2)
Total Protein: 5.5 g/dL — ABNORMAL LOW (ref 6.5–8.1)

## 2015-04-10 LAB — CBC
HCT: 35.2 % — ABNORMAL LOW (ref 36.0–46.0)
Hemoglobin: 11.5 g/dL — ABNORMAL LOW (ref 12.0–15.0)
MCH: 28 pg (ref 26.0–34.0)
MCHC: 32.7 g/dL (ref 30.0–36.0)
MCV: 85.6 fL (ref 78.0–100.0)
Platelets: 378 10*3/uL (ref 150–400)
RBC: 4.11 MIL/uL (ref 3.87–5.11)
RDW: 12.7 % (ref 11.5–15.5)
WBC: 13.8 10*3/uL — ABNORMAL HIGH (ref 4.0–10.5)

## 2015-04-10 LAB — ABO/RH: ABO/RH(D): AB POS

## 2015-04-10 LAB — TROPONIN I

## 2015-04-10 LAB — APTT: aPTT: 35 seconds (ref 24–37)

## 2015-04-10 LAB — BASIC METABOLIC PANEL
Anion gap: 14 (ref 5–15)
BUN: 15 mg/dL (ref 6–20)
CHLORIDE: 101 mmol/L (ref 101–111)
CO2: 26 mmol/L (ref 22–32)
CREATININE: 0.44 mg/dL (ref 0.44–1.00)
Calcium: 8.8 mg/dL — ABNORMAL LOW (ref 8.9–10.3)
GFR calc Af Amer: >60 mL/min (ref >60)
GFR calc non Af Amer: >60 mL/min (ref >60)
Glucose, Bld: 122 mg/dL — ABNORMAL HIGH (ref 65–99)
Potassium: 4.6 mmol/L (ref 3.5–5.1)
Sodium: 141 mmol/L (ref 135–145)

## 2015-04-10 LAB — PROTIME-INR
INR: 1.5 — ABNORMAL HIGH (ref 0.00–1.49)
Prothrombin Time: 18.2 seconds — ABNORMAL HIGH (ref 11.6–15.2)

## 2015-04-10 LAB — PREPARE RBC (CROSSMATCH)

## 2015-04-10 SURGERY — VIDEO ASSISTED THORACOSCOPY (VATS)/EMPYEMA
Anesthesia: General | Site: Chest | Laterality: Right

## 2015-04-10 MED ORDER — ONDANSETRON HCL 4 MG/2ML IJ SOLN: 4.0000 mg | Freq: Four times a day (QID) | INTRAMUSCULAR | Status: DC | PRN

## 2015-04-10 MED ORDER — TRAMADOL HCL 50 MG PO TABS
50.0000 mg | ORAL_TABLET | Freq: Four times a day (QID) | ORAL | Status: DC | PRN
  Administered 2015-04-11: 50 mg via ORAL
  Filled 2015-04-10: qty 2

## 2015-04-10 MED ORDER — GLYCOPYRROLATE 0.2 MG/ML IJ SOLN
INTRAMUSCULAR | Status: AC
  Filled 2015-04-10: qty 2

## 2015-04-10 MED ORDER — FENTANYL 40 MCG/ML IV SOLN
INTRAVENOUS | Status: DC
  Administered 2015-04-10: 21:00:00 via INTRAVENOUS
  Administered 2015-04-11: 255 ug via INTRAVENOUS
  Administered 2015-04-11: 195 ug via INTRAVENOUS
  Administered 2015-04-11: 210 ug via INTRAVENOUS
  Administered 2015-04-11: 90 ug via INTRAVENOUS
  Administered 2015-04-11: 180 ug via INTRAVENOUS
  Administered 2015-04-12: 120 ug via INTRAVENOUS
  Administered 2015-04-12: 195 ug via INTRAVENOUS
  Administered 2015-04-12: 30 ug via INTRAVENOUS
  Administered 2015-04-13: 195 ug via INTRAVENOUS
  Administered 2015-04-13: 165 ug via INTRAVENOUS
  Administered 2015-04-14: 120 ug via INTRAVENOUS
  Administered 2015-04-14: 165 ug via INTRAVENOUS
  Administered 2015-04-14: 45 ug via INTRAVENOUS
  Administered 2015-04-14: 15 ug via INTRAVENOUS
  Administered 2015-04-14: 60 ug via INTRAVENOUS
  Administered 2015-04-14: 15 ug via INTRAVENOUS
  Administered 2015-04-15 (×2): 30 ug via INTRAVENOUS
  Administered 2015-04-15: 45 ug via INTRAVENOUS
  Filled 2015-04-10 (×3): qty 25

## 2015-04-10 MED ORDER — SUGAMMADEX SODIUM 200 MG/2ML IV SOLN
INTRAVENOUS | Status: DC | PRN
  Administered 2015-04-10: 105.2 mg via INTRAVENOUS

## 2015-04-10 MED ORDER — ONDANSETRON HCL 4 MG/2ML IJ SOLN
INTRAMUSCULAR | Status: AC
  Filled 2015-04-10: qty 2

## 2015-04-10 MED ORDER — DEXTROSE 5 % IV SOLN
1.5000 g | INTRAVENOUS | Status: AC
  Administered 2015-04-10: 1.5 g via INTRAVENOUS
  Filled 2015-04-10 (×2): qty 1.5

## 2015-04-10 MED ORDER — BUPIVACAINE HCL (PF) 0.5 % IJ SOLN
INTRAMUSCULAR | Status: DC | PRN
  Administered 2015-04-10: 10 mL

## 2015-04-10 MED ORDER — ONDANSETRON HCL 4 MG/2ML IJ SOLN
INTRAMUSCULAR | Status: DC | PRN
  Administered 2015-04-10: 4 mg via INTRAVENOUS

## 2015-04-10 MED ORDER — HYDROMORPHONE HCL 1 MG/ML IJ SOLN
0.2500 mg | INTRAMUSCULAR | Status: DC | PRN
  Administered 2015-04-10 (×4): 0.5 mg via INTRAVENOUS

## 2015-04-10 MED ORDER — FENTANYL 40 MCG/ML IV SOLN
INTRAVENOUS | Status: AC
  Filled 2015-04-10: qty 25

## 2015-04-10 MED ORDER — PIPERACILLIN-TAZOBACTAM 3.375 G IVPB
3.3750 g | Freq: Three times a day (TID) | INTRAVENOUS | Status: DC
  Administered 2015-04-11 – 2015-04-16 (×16): 3.375 g via INTRAVENOUS
  Filled 2015-04-10 (×20): qty 50

## 2015-04-10 MED ORDER — FENTANYL CITRATE (PF) 100 MCG/2ML IJ SOLN
INTRAMUSCULAR | Status: DC | PRN
  Administered 2015-04-10 (×2): 25 ug via INTRAVENOUS
  Administered 2015-04-10: 50 ug via INTRAVENOUS
  Administered 2015-04-10: 100 ug via INTRAVENOUS

## 2015-04-10 MED ORDER — HYDROMORPHONE HCL 1 MG/ML IJ SOLN
INTRAMUSCULAR | Status: AC
  Filled 2015-04-10: qty 1

## 2015-04-10 MED ORDER — OXYCODONE HCL 5 MG PO TABS
5.0000 mg | ORAL_TABLET | ORAL | Status: DC | PRN
  Administered 2015-04-11 – 2015-04-14 (×10): 5 mg via ORAL
  Filled 2015-04-10 (×10): qty 1

## 2015-04-10 MED ORDER — DIPHENHYDRAMINE HCL 12.5 MG/5ML PO ELIX: 12.5000 mg | ORAL_SOLUTION | Freq: Four times a day (QID) | ORAL | Status: DC | PRN

## 2015-04-10 MED ORDER — MORPHINE SULFATE (PF) 4 MG/ML IV SOLN
2.0000 mg | Freq: Once | INTRAVENOUS | Status: AC
  Administered 2015-04-10: 2 mg via INTRAVENOUS
  Filled 2015-04-10: qty 1

## 2015-04-10 MED ORDER — MIDAZOLAM HCL 2 MG/2ML IJ SOLN
INTRAMUSCULAR | Status: AC
  Filled 2015-04-10: qty 2

## 2015-04-10 MED ORDER — ALBUTEROL SULFATE (2.5 MG/3ML) 0.083% IN NEBU: 2.5000 mg | INHALATION_SOLUTION | RESPIRATORY_TRACT | Status: DC

## 2015-04-10 MED ORDER — DEXTROSE 5 % IV SOLN
1.5000 g | INTRAVENOUS | Status: DC
  Filled 2015-04-10: qty 1.5

## 2015-04-10 MED ORDER — ROCURONIUM BROMIDE 100 MG/10ML IV SOLN
INTRAVENOUS | Status: DC | PRN
  Administered 2015-04-10: 10 mg via INTRAVENOUS
  Administered 2015-04-10: 40 mg via INTRAVENOUS

## 2015-04-10 MED ORDER — POTASSIUM CHLORIDE 10 MEQ/50ML IV SOLN: 10.0000 meq | Freq: Every day | INTRAVENOUS | Status: DC | PRN

## 2015-04-10 MED ORDER — SODIUM CHLORIDE 0.9% FLUSH: 9.0000 mL | INTRAVENOUS | Status: DC | PRN

## 2015-04-10 MED ORDER — SODIUM CHLORIDE 0.9 % IV BOLUS (SEPSIS)
1000.0000 mL | Freq: Once | INTRAVENOUS | Status: AC
  Administered 2015-04-10: 1000 mL via INTRAVENOUS

## 2015-04-10 MED ORDER — LACTATED RINGERS IV SOLN
INTRAVENOUS | Status: DC | PRN
  Administered 2015-04-10: 16:00:00 via INTRAVENOUS

## 2015-04-10 MED ORDER — BUPIVACAINE 0.5 % ON-Q PUMP SINGLE CATH 400 ML
400.0000 mL | INJECTION | Status: DC
  Administered 2015-04-10: 400 mL
  Filled 2015-04-10: qty 400

## 2015-04-10 MED ORDER — SUGAMMADEX SODIUM 200 MG/2ML IV SOLN
INTRAVENOUS | Status: AC
  Filled 2015-04-10: qty 2

## 2015-04-10 MED ORDER — NEOSTIGMINE METHYLSULFATE 10 MG/10ML IV SOLN
INTRAVENOUS | Status: AC
  Filled 2015-04-10: qty 1

## 2015-04-10 MED ORDER — NALOXONE HCL 0.4 MG/ML IJ SOLN: 0.4000 mg | INTRAMUSCULAR | Status: DC | PRN

## 2015-04-10 MED ORDER — VANCOMYCIN HCL IN DEXTROSE 750-5 MG/150ML-% IV SOLN
750.0000 mg | Freq: Two times a day (BID) | INTRAVENOUS | Status: DC
  Administered 2015-04-11 – 2015-04-13 (×5): 750 mg via INTRAVENOUS
  Filled 2015-04-10 (×8): qty 150

## 2015-04-10 MED ORDER — ACETAMINOPHEN 500 MG PO TABS
1000.0000 mg | ORAL_TABLET | Freq: Four times a day (QID) | ORAL | Status: AC
  Administered 2015-04-11 – 2015-04-15 (×16): 1000 mg via ORAL
  Filled 2015-04-10 (×17): qty 2

## 2015-04-10 MED ORDER — SENNOSIDES-DOCUSATE SODIUM 8.6-50 MG PO TABS
1.0000 | ORAL_TABLET | Freq: Every day | ORAL | Status: DC
  Administered 2015-04-11 – 2015-04-15 (×4): 1 via ORAL
  Filled 2015-04-10 (×4): qty 1

## 2015-04-10 MED ORDER — LACTATED RINGERS IV SOLN
INTRAVENOUS | Status: DC
  Administered 2015-04-10: 16:00:00 via INTRAVENOUS

## 2015-04-10 MED ORDER — LIDOCAINE HCL (CARDIAC) 20 MG/ML IV SOLN
INTRAVENOUS | Status: DC | PRN
  Administered 2015-04-10: 50 mg via INTRAVENOUS

## 2015-04-10 MED ORDER — PROPOFOL 10 MG/ML IV BOLUS
INTRAVENOUS | Status: AC
  Filled 2015-04-10: qty 20

## 2015-04-10 MED ORDER — VANCOMYCIN HCL IN DEXTROSE 1-5 GM/200ML-% IV SOLN
1000.0000 mg | Freq: Once | INTRAVENOUS | Status: AC
  Administered 2015-04-10: 1000 mg via INTRAVENOUS
  Filled 2015-04-10: qty 200

## 2015-04-10 MED ORDER — ACETAMINOPHEN 160 MG/5ML PO SOLN: 1000.0000 mg | Freq: Four times a day (QID) | ORAL | Status: AC

## 2015-04-10 MED ORDER — ALBUMIN HUMAN 5 % IV SOLN
INTRAVENOUS | Status: DC | PRN
  Administered 2015-04-10: 17:00:00 via INTRAVENOUS

## 2015-04-10 MED ORDER — 0.9 % SODIUM CHLORIDE (POUR BTL) OPTIME
TOPICAL | Status: DC | PRN
  Administered 2015-04-10: 2000 mL

## 2015-04-10 MED ORDER — ACETAMINOPHEN 325 MG PO TABS
650.0000 mg | ORAL_TABLET | Freq: Once | ORAL | Status: AC
  Administered 2015-04-10: 650 mg via ORAL
  Filled 2015-04-10: qty 2

## 2015-04-10 MED ORDER — PROPOFOL 10 MG/ML IV BOLUS
INTRAVENOUS | Status: DC | PRN
  Administered 2015-04-10: 70 mg via INTRAVENOUS
  Administered 2015-04-10: 30 mg via INTRAVENOUS

## 2015-04-10 MED ORDER — FENTANYL CITRATE (PF) 250 MCG/5ML IJ SOLN
INTRAMUSCULAR | Status: AC
  Filled 2015-04-10: qty 5

## 2015-04-10 MED ORDER — DEXTROSE-NACL 5-0.9 % IV SOLN
INTRAVENOUS | Status: DC
  Administered 2015-04-10: 21:00:00 via INTRAVENOUS

## 2015-04-10 MED ORDER — HEMOSTATIC AGENTS (NO CHARGE) OPTIME
TOPICAL | Status: DC | PRN
  Administered 2015-04-10: 1 via TOPICAL

## 2015-04-10 MED ORDER — SODIUM CHLORIDE 0.9 % IV BOLUS (SEPSIS): 1000.0000 mL | Freq: Once | INTRAVENOUS | Status: DC

## 2015-04-10 MED ORDER — MORPHINE SULFATE (PF) 2 MG/ML IV SOLN
INTRAVENOUS | Status: AC
  Filled 2015-04-10: qty 1

## 2015-04-10 MED ORDER — DIPHENHYDRAMINE HCL 50 MG/ML IJ SOLN: 12.5000 mg | Freq: Four times a day (QID) | INTRAMUSCULAR | Status: DC | PRN

## 2015-04-10 MED ORDER — PIPERACILLIN-TAZOBACTAM 3.375 G IVPB 30 MIN
3.3750 g | Freq: Once | INTRAVENOUS | Status: AC
  Administered 2015-04-10: 3.375 g via INTRAVENOUS
  Filled 2015-04-10 (×2): qty 50

## 2015-04-10 MED ORDER — BUPIVACAINE HCL (PF) 0.5 % IJ SOLN
INTRAMUSCULAR | Status: AC
  Filled 2015-04-10: qty 10

## 2015-04-10 MED ORDER — BISACODYL 5 MG PO TBEC
10.0000 mg | DELAYED_RELEASE_TABLET | Freq: Every day | ORAL | Status: DC
  Administered 2015-04-11 – 2015-04-13 (×3): 10 mg via ORAL
  Filled 2015-04-10 (×6): qty 2

## 2015-04-10 MED ORDER — PROMETHAZINE HCL 25 MG/ML IJ SOLN: 6.2500 mg | INTRAMUSCULAR | Status: DC | PRN

## 2015-04-10 SURGICAL SUPPLY — 68 items
BAG DECANTER FOR FLEXI CONT (MISCELLANEOUS) IMPLANT
BLADE SURG 11 STRL SS (BLADE) ×3 IMPLANT
CANISTER SUCTION 2500CC (MISCELLANEOUS) ×3 IMPLANT
CATH KIT ON Q 5IN SLV (PAIN MANAGEMENT) ×3 IMPLANT
CATH ROBINSON RED A/P 22FR (CATHETERS) IMPLANT
CATH THORACIC 28FR (CATHETERS) IMPLANT
CATH THORACIC 36FR (CATHETERS) IMPLANT
CATH THORACIC 36FR RT ANG (CATHETERS) IMPLANT
CONN ST 1/4X3/8  BEN (MISCELLANEOUS) ×2
CONN ST 1/4X3/8 BEN (MISCELLANEOUS) ×1 IMPLANT
CONT SPEC 4OZ CLIKSEAL STRL BL (MISCELLANEOUS) ×6 IMPLANT
CUTTER ECHEON FLEX ENDO 45 340 (ENDOMECHANICALS) ×3 IMPLANT
DERMABOND ADVANCED (GAUZE/BANDAGES/DRESSINGS) ×2
DERMABOND ADVANCED .7 DNX12 (GAUZE/BANDAGES/DRESSINGS) ×1 IMPLANT
DRAPE LAPAROSCOPIC ABDOMINAL (DRAPES) ×3 IMPLANT
DRAPE WARM FLUID 44X44 (DRAPE) ×3 IMPLANT
ELECT BLADE 4.0 EZ CLEAN MEGAD (MISCELLANEOUS) ×3
ELECT REM PT RETURN 9FT ADLT (ELECTROSURGICAL) ×3
ELECTRODE BLDE 4.0 EZ CLN MEGD (MISCELLANEOUS) ×1 IMPLANT
ELECTRODE REM PT RTRN 9FT ADLT (ELECTROSURGICAL) ×1 IMPLANT
GAUZE SPONGE 4X4 12PLY STRL (GAUZE/BANDAGES/DRESSINGS) ×3 IMPLANT
GAUZE SPONGE 4X4 16PLY XRAY LF (GAUZE/BANDAGES/DRESSINGS) ×3 IMPLANT
GLOVE BIOGEL PI IND STRL 6.5 (GLOVE) ×2 IMPLANT
GLOVE BIOGEL PI INDICATOR 6.5 (GLOVE) ×4
GLOVE SURG SS PI 7.0 STRL IVOR (GLOVE) ×6 IMPLANT
GOWN SPEC L4 XLG W/TWL (GOWN DISPOSABLE) ×6 IMPLANT
GOWN STRL REUS W/ TWL LRG LVL3 (GOWN DISPOSABLE) ×3 IMPLANT
GOWN STRL REUS W/TWL LRG LVL3 (GOWN DISPOSABLE) ×6
KIT BASIN OR (CUSTOM PROCEDURE TRAY) ×3 IMPLANT
KIT ROOM TURNOVER OR (KITS) ×3 IMPLANT
KIT SUCTION CATH 14FR (SUCTIONS) ×3 IMPLANT
NS IRRIG 1000ML POUR BTL (IV SOLUTION) ×6 IMPLANT
PACK CHEST (CUSTOM PROCEDURE TRAY) ×3 IMPLANT
PAD ARMBOARD 7.5X6 YLW CONV (MISCELLANEOUS) ×6 IMPLANT
RELOAD GOLD ECHELON 45 (STAPLE) ×3 IMPLANT
SEALANT SURG COSEAL 4ML (VASCULAR PRODUCTS) ×3 IMPLANT
SOLUTION ANTI FOG 6CC (MISCELLANEOUS) ×3 IMPLANT
SPONGE GAUZE 4X4 12PLY STER LF (GAUZE/BANDAGES/DRESSINGS) ×6 IMPLANT
SPONGE TONSIL 1.25 RF SGL STRG (GAUZE/BANDAGES/DRESSINGS) ×3 IMPLANT
SUT CHROMIC 3 0 SH 27 (SUTURE) IMPLANT
SUT ETHILON 3 0 PS 1 (SUTURE) IMPLANT
SUT PROLENE 3 0 SH DA (SUTURE) IMPLANT
SUT PROLENE 4 0 RB 1 (SUTURE)
SUT PROLENE 4-0 RB1 .5 CRCL 36 (SUTURE) IMPLANT
SUT PROLENE 6 0 C 1 30 (SUTURE) IMPLANT
SUT SILK  1 MH (SUTURE) ×4
SUT SILK 1 MH (SUTURE) ×2 IMPLANT
SUT SILK 1 TIES 10X30 (SUTURE) ×3 IMPLANT
SUT SILK 2 0SH CR/8 30 (SUTURE) IMPLANT
SUT SILK 3 0SH CR/8 30 (SUTURE) ×3 IMPLANT
SUT VIC AB 1 CTX 18 (SUTURE) ×6 IMPLANT
SUT VIC AB 2 TP1 27 (SUTURE) ×3 IMPLANT
SUT VIC AB 2-0 CT2 18 VCP726D (SUTURE) IMPLANT
SUT VIC AB 2-0 CTX 36 (SUTURE) ×3 IMPLANT
SUT VIC AB 3-0 SH 18 (SUTURE) IMPLANT
SUT VIC AB 3-0 X1 27 (SUTURE) ×3 IMPLANT
SUT VICRYL 0 UR6 27IN ABS (SUTURE) IMPLANT
SUT VICRYL 2 TP 1 (SUTURE) IMPLANT
SWAB COLLECTION DEVICE MRSA (MISCELLANEOUS) ×3 IMPLANT
SYSTEM SAHARA CHEST DRAIN ATS (WOUND CARE) ×3 IMPLANT
TAPE CLOTH SURG 4X10 WHT LF (GAUZE/BANDAGES/DRESSINGS) ×6 IMPLANT
TIP APPLICATOR SPRAY EXTEND 16 (VASCULAR PRODUCTS) IMPLANT
TOWEL OR 17X24 6PK STRL BLUE (TOWEL DISPOSABLE) ×3 IMPLANT
TOWEL OR 17X26 10 PK STRL BLUE (TOWEL DISPOSABLE) ×6 IMPLANT
TRAP SPECIMEN MUCOUS 40CC (MISCELLANEOUS) IMPLANT
TRAY FOLEY CATH 16FRSI W/METER (SET/KITS/TRAYS/PACK) ×3 IMPLANT
TUBE ANAEROBIC SPECIMEN COL (MISCELLANEOUS) ×3 IMPLANT
WATER STERILE IRR 1000ML POUR (IV SOLUTION) ×6 IMPLANT

## 2015-04-10 NOTE — Transfer of Care (Signed)
Immediate Anesthesia Transfer of Care Note  Patient: Sara Li  Procedure(s) Performed: Procedure(s): RIGHT VIDEO ASSISTED THORACOSCOPY (VATS)/EMPYEMA (Right)  Patient Location: PACU  Anesthesia Type:General  Level of Consciousness: awake and alert   Airway & Oxygen Therapy: Patient Spontanous Breathing and Patient connected to nasal cannula oxygen  Post-op Assessment: Report given to RN and Post -op Vital signs reviewed and stable  Post vital signs: Reviewed and stable  Last Vitals:  Filed Vitals:   04/10/15 1208 04/10/15 1439  BP: 101/72 131/81  Pulse: 93 101  Temp: 36.7 C 36.9 C  Resp:  18    Complications: No apparent anesthesia complications

## 2015-04-10 NOTE — Consult Note (Signed)
Pharmacy Antibiotic Note  Sara Li is a 50 y.o. female admitted on 04/10/2015 with fever and chest pain. CXR shows near complete opacification of the right lung, likely reflecting a large pleural effusion. Pharmacy has been consulted vancomycin and zosyn dosing.  Plan: 1) Vancomycin 1g x 1 then  q12 2) Zosyn 3.375g q8 (4 hour infusion) 3) Follow renal function, cultures, LOT, level if needed  Weight: 116 lb (52.617 kg)  Temp (24hrs), Avg:100 F (37.8 C), Min:100 F (37.8 C), Max:100 F (37.8 C)   Recent Labs Lab 04/06/15 1650 04/10/15 1005 04/10/15 1012  WBC 13.0* 15.9*  --   CREATININE 0.55  --   --   LATICACIDVEN  --   --  1.05    Estimated Creatinine Clearance: 63.5 mL/min (by C-G formula based on Cr of 0.55).    No Known Allergies  Antimicrobials this admission: 2/9 Vancomycin >> 2/9 Zosyn >>   Dose adjustments this admission: N/A  Microbiology results: 2/9 blood x2 >>  Thank you for allowing pharmacy to be a part of this patient's care.  Fredrik Rigger 04/10/2015 10:38 AM

## 2015-04-10 NOTE — ED Provider Notes (Signed)
CSN: [id number]     Arrival date & time 04/10/15  8295 History   First MD Initiated Contact with Patient 04/10/15 [phone]     Chief Complaint  Patient presents with  . Chest Pain     (Consider location/radiation/quality/duration/timing/severity/associated sxs/prior Treatment) Patient is a 50 y.o. female presenting with chest pain. The history is provided by the patient and medical records.  Chest Pain   50 year old female with no significant past medical history presenting to the ED for chest pain. This is been ongoing for the past 10 days. Pain is localized to her right chest and right upper back. She was seen in the ED for this on 04/06/15-- she had CXR and CT angio of chest which revealed a right sided pleural effusion.  She was discharged home with pain medication (tramadol) but this does not seem to be helping.  She has begun using "pain patches" on her back to help with pain.  She has continued pain with breathing but denies frank SOB.  She denies cough, sore throat, fever, chills, sweats, or sick contacts at home. Patient denies smoking history. No known cardiac history. Patient does not currently have a primary care doctor.  History reviewed. No pertinent past medical history. History reviewed. No pertinent past surgical history. History reviewed. No pertinent family history. Social History  Substance Use Topics  . Smoking status: Never Smoker   . Smokeless tobacco: None  . Alcohol Use: No   OB History    No data available     Review of Systems  Cardiovascular: Positive for chest pain.  All other systems reviewed and are negative.     Allergies  Review of patient's allergies indicates no known allergies.  Home Medications   Prior to Admission medications   Medication Sig Start Date End Date Taking? Authorizing Provider  traMADol (ULTRAM) 50 MG tablet Take 1 tablet (50 mg total) by mouth every 6 (six) hours as needed. 04/06/15   Benjiman Core, MD   BP 133/91 mmHg  Pulse  118  Temp(Src) 100 F (37.8 C) (Oral)  Resp 38  Wt 52.617 kg  SpO2 95%  LMP 04/06/2015   Physical Exam  Constitutional: She is oriented to person, place, and time. She appears well-developed and well-nourished. No distress.  Thin, appears uncomfortable  HENT:  Head: Normocephalic and atraumatic.  Right Ear: Tympanic membrane and ear canal normal.  Left Ear: Tympanic membrane and ear canal normal.  Nose: Nose normal.  Mouth/Throat: Uvula is midline and oropharynx is clear and moist. Mucous membranes are dry.  Mildly dry mucous membranes  Eyes: Conjunctivae and EOM are normal. Pupils are equal, round, and reactive to light.  Neck: Normal range of motion. Neck supple.  Cardiovascular: Regular rhythm and normal heart sounds.  Tachycardia present.   Pulmonary/Chest: Effort normal. Tachypnea noted. No respiratory distress. She has decreased breath sounds. She has no wheezes. She has no rhonchi. She has no rales.  Diminished breath sounds throughout entire right lung, no overt wheezes or rhonchi  Abdominal: Soft. Bowel sounds are normal. There is no tenderness. There is no guarding.  Musculoskeletal: Normal range of motion. She exhibits no edema.  Neurological: She is alert and oriented to person, place, and time.  Skin: Skin is warm and dry. She is not diaphoretic.  Psychiatric: She has a normal mood and affect.  Nursing note and vitals reviewed.   ED Course  Procedures (including critical care time) Labs Review Labs Reviewed  CBC WITH DIFFERENTIAL/PLATELET - Abnormal; Notable  for the following:    WBC 15.9 (*)    Platelets 440 (*)    Neutro Abs 13.8 (*)    All other components within normal limits  CULTURE, BLOOD (ROUTINE X 2)  CULTURE, BLOOD (ROUTINE X 2)  BASIC METABOLIC PANEL  TROPONIN I  I-STAT CG4 LACTIC ACID, ED    Imaging Review Dg Chest 2 View  04/10/2015  CLINICAL DATA:  Chest pain for 10 days EXAM: CHEST  2 VIEW COMPARISON:  CT chest 04/06/2015 FINDINGS: There is  near complete opacification of the right lung with a small area of aerated lung at the apex medially. There is mild left basilar atelectasis. The left lung is otherwise clear. There is no pneumothorax. The heart and mediastinal contours are unremarkable. The osseous structures are unremarkable. IMPRESSION: Near-complete opacification of the right lung likely reflecting a large pleural effusion with associated atelectasis. Small area of aerated lung at the apex. This has significantly progressed compared with 04/06/2015. Electronically Signed   By: Elige Ko   On: 04/10/2015 09:57   I have personally reviewed and evaluated these images and lab results as part of my medical decision-making.   EKG Interpretation   Date/Time:  Thursday April 10 2015 09:35:34 EST Ventricular Rate:  116 PR Interval:  118 QRS Duration: 88 QT Interval:  292 QTC Calculation: 406 R Axis:   91 Text Interpretation:  Sinus tachycardia Aberrant conduction of SV  complex(es) Borderline right axis deviation No significant change since  last tracing  aside from tachycardia  Confirmed by LIU MD, DANA ([id number]) on  04/10/2015 9:44:54 AM      MDM   Final diagnoses:  Pleural effusion on right   50 year old female here with chest pain for 10 days. On exam she appears uncomfortable. She has a low-grade fever is tachycardic and Neck. Her blood pressure is stable. She continues oxygenating well on room air, was placed on 2L O2 for comfort.  EKG sinus tachycardia without acute ischemic changes. Her chest x-ray does reveal near opacification of the right long, suspected due to large pleural effusion. Of note, this is substantially bigger than on her CXR from 4 days ago.  Cases been discussed with CT surgery, Dr. Morton Peters-- will transfer to [place], plan for thoracentesis later this afternoon.  Patient to be kept NPO, start vanc and zosyn.  Patient admitted to 2W.  EMTALA completed.  Garlon Hatchet, PA-C 04/10/15  1131  Lavera Guise, MD 04/10/15 2109

## 2015-04-10 NOTE — ED Notes (Signed)
Pt lying supine, reports chest pain x 10 days, "squeezing, tightness" per son who is interpreting. resp are rapid and shallow. ekg performed while pt being triaged.

## 2015-04-10 NOTE — ED Notes (Signed)
Patient transported to x-ray. ?

## 2015-04-10 NOTE — Brief Op Note (Signed)
04/10/2015  5:28 PM        [address]       [place] [address]             [phone]     04/10/2015  5:28 PM  PATIENT:  Sara Li  50 y.o. female  PRE-OPERATIVE DIAGNOSIS:  RIGHT EMPYEMA   POST-OPERATIVE DIAGNOSIS:  RIGHT EMPYEMA   PROCEDURE:  Procedure(s): RIGHT VIDEO ASSISTED THORACOSCOPY (VATS)/EMPYEMA  SURGEON:  Surgeon(s): Kerin Perna, MD  PHYSICIAN ASSISTANT: Cleve Paolillo PA-C  ANESTHESIA:   general  SPECIMEN:  Source of Specimen:  PLEURAL PEEL/FLUID FOR CULTURES/PATH/CYTOLOGY, SMALL RML WEDGE FOR PATH  DISPOSITION OF SPECIMEN:  Pathology  DRAINS: 2  Chest Tube(s) in the RIGHT HEMITHORAX   PATIENT CONDITION:  PACU - hemodynamically stable.  PRE-OPERATIVE WEIGHT: 52kg  COMPLICATIONS: NO KNOWN

## 2015-04-10 NOTE — Anesthesia Procedure Notes (Signed)
Procedure Name: Intubation Date/Time: 04/10/2015 4:04 PM Performed by: Gwenyth Allegra Pre-anesthesia Checklist: Emergency Drugs available, Timeout performed, Patient identified, Patient being monitored and Suction available Patient Re-evaluated:Patient Re-evaluated prior to inductionOxygen Delivery Method: Circle system utilized Preoxygenation: Pre-oxygenation with 100% oxygen Intubation Type: IV induction Ventilation: Mask ventilation without difficulty Endobronchial tube: Left and Double lumen EBT and 35 Fr Number of attempts: 1 Placement Confirmation: ETT inserted through vocal cords under direct vision,  breath sounds checked- equal and bilateral and positive ETCO2 Secured at: 28 cm Tube secured with: Tape Dental Injury: Teeth and Oropharynx as per pre-operative assessment

## 2015-04-10 NOTE — Anesthesia Preprocedure Evaluation (Addendum)
Anesthesia Evaluation  Patient identified by MRN, date of birth, ID band Patient awake    Reviewed: Allergy & Precautions, NPO status , Patient's Chart, lab work & pertinent test results  Airway Mallampati: II  TM Distance: >3 FB Neck ROM: Full    Dental no notable dental hx.    Pulmonary  Empyema on Right   Pulmonary exam normal breath sounds clear to auscultation       Cardiovascular negative cardio ROS Normal cardiovascular exam Rhythm:Regular Rate:Normal     Neuro/Psych negative neurological ROS  negative psych ROS   GI/Hepatic negative GI ROS, Neg liver ROS,   Endo/Other  negative endocrine ROS  Renal/GU negative Renal ROS  negative genitourinary   Musculoskeletal negative musculoskeletal ROS (+)   Abdominal   Peds negative pediatric ROS (+)  Hematology negative hematology ROS (+)   Anesthesia Other Findings No tylenol  Reproductive/Obstetrics negative OB ROS                            Anesthesia Physical Anesthesia Plan  ASA: II  Anesthesia Plan: General   Post-op Pain Management:    Induction: Intravenous  Airway Management Planned: Double Lumen EBT  Additional Equipment: Arterial line  Intra-op Plan:   Post-operative Plan: Extubation in OR and Possible Post-op intubation/ventilation  Informed Consent: I have reviewed the patients History and Physical, chart, labs and discussed the procedure including the risks, benefits and alternatives for the proposed anesthesia with the patient or authorized representative who has indicated his/her understanding and acceptance.   Dental advisory given  Plan Discussed with: CRNA  Anesthesia Plan Comments: (No tylenol)       Anesthesia Quick Evaluation

## 2015-04-10 NOTE — ED Provider Notes (Signed)
Medical screening examination/treatment/procedure(s) were conducted as a shared visit with non-physician practitioner(s) and myself.  I personally evaluated the patient during the encounter.   EKG Interpretation   Date/Time:  Thursday April 10 2015 09:35:34 EST Ventricular Rate:  116 PR Interval:  118 QRS Duration: 88 QT Interval:  292 QTC Calculation: 406 R Axis:   91 Text Interpretation:  Sinus tachycardia Aberrant conduction of SV  complex(es) Borderline right axis deviation No significant change since  last tracing  aside from tachycardia  Confirmed by Devinne Epstein MD, Alejandro Adcox [phone]) on  04/10/2015 9:44:54 AM       50 year old female, otherwise healthy, who is primarily [place] speaking who presents with chest pain. Ongoing for the last 10 days, with pleuritic chest pain to the right side of her chest and upper back. I was seen in the ED February 5 for this, had a CTA of her chest only revealing small right-sided pleural effusion. Has had progressively worsening pain without significant shortness of breath. No fevers, chills, nausea or vomiting. On arrival has a low-grade fever, tachycardia, tachypnea, the maintaining oxygenation on room air. With diminished breath sounds over the entirety of the right lung. Chest x-ray with progressive right-sided pleural effusion. With leukocytosis of 15.9, but normal lactate. Concern for possible infectious etiology and will cover with vancomycin and Zosyn. Will. plan for transfer to [facility] for admission to general medicine service. Dr. Maudie Flakes from cardiothoracic surgery will perform thoracentesis on arrival.  Lavera Guise, MD 04/10/15 [phone]

## 2015-04-10 NOTE — OR Nursing (Signed)
Pt has a single artifical tooth that was given to pts son at bedside.

## 2015-04-10 NOTE — ED Notes (Signed)
pa at bedside. 

## 2015-04-10 NOTE — H&P (Signed)
Subjective:  History of the present illness:  The patient is a 50 year old [place] female who presented last Friday to the emergency department with chest pain. She has had discomfort for approximately the last 10 days. It is described as pleuritic and located on the right side of her chest and upper back. On presentation to the emergency department on February 5 a CT scan revealed a small right-sided pleural effusion. The pain did progress but was not associated with shortness of breath. She denied fevers, chills, nausea or vomiting. On today's date she re-presented to the emergency department with a low-grade fever, tachycardia, tachypnea and was found to have diminished breath sounds over the entirety of her right lung fields. A chest x-ray showed progression of the right sided effusion. She was also found to have a leukocytosis with white blood cell count of 15 point 9 and normal lactate level. She was started on intravenous vancomycin and Zosyn and was accepted and transferred to [facility] for further management to include video-assisted thoracoscopy for drainage of the effusion/possible empyema. She is a nonsmoker and her past medical history is unremarkable.  Patient Active Problem List   Diagnosis Date Noted  . Empyema of right pleural space ([place]) 04/10/2015   History reviewed. No pertinent past medical history.  History reviewed. No pertinent past surgical history.  Prescriptions prior to admission  Medication Sig Dispense Refill Last Dose  . traMADol (ULTRAM) 50 MG tablet Take 1 tablet (50 mg total) by mouth every 6 (six) hours as needed. 10 tablet 0    No Known Allergies  Social History  Substance Use Topics  . Smoking status: Never Smoker   . Smokeless tobacco: Not on file  . Alcohol Use: No    History reviewed. No pertinent family history.  Review of Systems Pertinent items are noted in HPI.  Objective:   Patient Vitals for the past 8 hrs:  BP Temp Temp src  Pulse Resp SpO2 Weight  04/10/15 1208 101/72 mmHg 98.1 F (36.7 C) Oral 93 - 97 % -  04/10/15 1058 108/83 mmHg - - 100 23 97 % -  04/10/15 1057 - 98.5 F (36.9 C) Oral - - - -  04/10/15 1043 101/88 mmHg - - 100 (!) 29 96 % -  04/10/15 0941 133/91 mmHg 100 F (37.8 C) Oral 118 (!) 38 95 % 116 lb (52.617 kg)  04/10/15 0937 - - - (!) 122 (!) 28 95 % -  04/10/15 0936 133/91 mmHg - - (!) 124 - 96 % -          General appearance: alert, cooperative and no distress Head: Normocephalic, without obvious abnormality, atraumatic Eyes: negative Ears: not examined Throat: lips, mucosa, and tongue normal; teeth and gums normal Neck: no adenopathy, no JVD, supple, symmetrical, trachea midline and thyroid not enlarged, symmetric, no tenderness/mass/nodules Back: symmetric, no curvature. ROM normal. No CVA tenderness. Lungs: dim on right side Heart: regular rate and rhythm, S1, S2 normal, no murmur, click, rub or gallop Abdomen: soft, non-tender; bowel sounds normal; no masses,  no organomegaly Extremities: no edema, pulses intact Pulses: 2+ and symmetric Skin: some petechia on right side of back where she as an OTC pain patch Lymph nodes: Cervical, supraclavicular, and axillary nodes normal. Neurologic: Grossly normal  Coagulation: No results found for: INR, APTT Cardiac markers: No results found for: CKMB, TROPONINT, MYOGLOBIN ABGs: No results found for: [facility] Dg Chest 2 View  04/10/2015  CLINICAL DATA:  Chest pain for 10 days  EXAM: CHEST  2 VIEW COMPARISON:  CT chest 04/06/2015 FINDINGS: There is near complete opacification of the right lung with a small area of aerated lung at the apex medially. There is mild left basilar atelectasis. The left lung is otherwise clear. There is no pneumothorax. The heart and mediastinal contours are unremarkable. The osseous structures are unremarkable. IMPRESSION: Near-complete opacification of the right lung likely reflecting a large pleural effusion with  associated atelectasis. Small area of aerated lung at the apex. This has significantly progressed compared with 04/06/2015. Electronically Signed   By: Kathreen Devoid   On: 04/10/2015 09:57   Dg Chest 2 View  04/06/2015  CLINICAL DATA:  50 year old female with atypical right side chest and mid thoracic back pain with illness for 10 days. Initial encounter. EXAM: CHEST  2 VIEW COMPARISON:  None. FINDINGS: Small to moderate bilateral pleural effusions, small to moderate right pleural effusion. Dense associated right lung base opacification. Mildly low lung volumes superimposed, mild crowding at the left lung base which otherwise is clear. No pneumothorax or pleural effusion. Normal cardiac size and mediastinal contours. Visualized tracheal air column is within normal limits. No osseous abnormality identified. IMPRESSION: Small to moderate right pleural effusion. No other acute cardiopulmonary abnormality. Electronically Signed   By: Genevie Ann M.D.   On: 04/06/2015 13:23   Ct Angio Chest W/cm &/or Wo Cm  04/06/2015  CLINICAL DATA:  Chest pain.  Rule out pulmonary embolism EXAM: CT ANGIOGRAPHY CHEST WITH CONTRAST TECHNIQUE: Multidetector CT imaging of the chest was performed using the standard protocol during bolus administration of intravenous contrast. Multiplanar CT image reconstructions and MIPs were obtained to evaluate the vascular anatomy. CONTRAST:  147m OMNIPAQUE IOHEXOL 350 MG/ML SOLN COMPARISON:  None. FINDINGS: Negative for pulmonary embolism. Pulmonary arteries normal in caliber. Heart size normal. No pericardial effusion. Thoracic aorta appears normal. No dissection or aneurysm. Elevated right hemidiaphragm. Small right pleural effusion. Right lower lobe atelectasis. No effusion on the left. No significant infiltrate on the left. Negative for mass or adenopathy. Elevated right hemidiaphragm with fatty infiltration of the liver. No acute abnormality upper abdomen. Review of the MIP images confirms the  above findings. IMPRESSION: Negative for pulmonary embolism Small right effusion and dependent atelectasis right lower lobe with elevation of the right hemidiaphragm. Electronically Signed   By: CFranchot GalloM.D.   On: 04/06/2015 16:22    Results for orders placed or performed during the hospital encounter of 04/10/15 (from the past 48 hour(s))  CBC with Differential     Status: Abnormal   Collection Time: 04/10/15 10:05 AM  Result Value Ref Range   WBC 15.9 (H) 4.0 - 10.5 K/uL   RBC 4.86 3.87 - 5.11 MIL/uL   Hemoglobin 13.6 12.0 - 15.0 g/dL   HCT 41.5 36.0 - 46.0 %   MCV 85.4 78.0 - 100.0 fL   MCH 28.0 26.0 - 34.0 pg   MCHC 32.8 30.0 - 36.0 g/dL   RDW 12.8 11.5 - 15.5 %   Platelets 440 (H) 150 - 400 K/uL   Neutrophils Relative % 87 %   Neutro Abs 13.8 (H) 1.7 - 7.7 K/uL   Lymphocytes Relative 7 %   Lymphs Abs 1.1 0.7 - 4.0 K/uL   Monocytes Relative 6 %   Monocytes Absolute 1.0 0.1 - 1.0 K/uL   Eosinophils Relative 0 %   Eosinophils Absolute 0.0 0.0 - 0.7 K/uL   Basophils Relative 0 %   Basophils Absolute 0.0 0.0 - 0.1 K/uL  Basic metabolic panel     Status: Abnormal   Collection Time: 04/10/15 10:05 AM  Result Value Ref Range   Sodium 141 135 - 145 mmol/L   Potassium 4.6 3.5 - 5.1 mmol/L   Chloride 101 101 - 111 mmol/L   CO2 26 22 - 32 mmol/L   Glucose, Bld 122 (H) 65 - 99 mg/dL   BUN 15 6 - 20 mg/dL   Creatinine, Ser 0.44 0.44 - 1.00 mg/dL   Calcium 8.8 (L) 8.9 - 10.3 mg/dL   GFR calc non Af Amer >60 >60 mL/min   GFR calc Af Amer >60 >60 mL/min    Comment: (NOTE) The eGFR has been calculated using the CKD EPI equation. This calculation has not been validated in all clinical situations. eGFR's persistently <60 mL/min signify possible Chronic Kidney Disease.    Anion gap 14 5 - 15  Troponin I     Status: None   Collection Time: 04/10/15 10:05 AM  Result Value Ref Range   Troponin I <0.03 <0.031 ng/mL    Comment:        NO INDICATION OF MYOCARDIAL INJURY.    I-Stat CG4 Lactic Acid, ED     Status: None   Collection Time: 04/10/15 10:12 AM  Result Value Ref Range   Lactic Acid, Venous 1.05 0.5 - 2.0 mmol/L    Assessment:   Active Problems:   Empyema of right pleural space (HCC)   Plan:   Right VATS , drainage and decortication   Patient examined and chesr CT, CXR personally reviewed The patient presents with rapidly progressing empyema on Right with collapse  of R lung. I have discussed the procerdure of VATS drainage and decortication of R lung using her son as interpretor. Will proceed with surgery later today

## 2015-04-11 ENCOUNTER — Encounter (HOSPITAL_COMMUNITY): Payer: Self-pay | Admitting: Cardiothoracic Surgery

## 2015-04-11 ENCOUNTER — Inpatient Hospital Stay (HOSPITAL_COMMUNITY): Payer: BLUE CROSS/BLUE SHIELD

## 2015-04-11 LAB — TYPE AND SCREEN
ABO/RH(D): AB POS
Antibody Screen: NEGATIVE
Unit division: 0
Unit division: 0

## 2015-04-11 LAB — CBC
HCT: 36 % (ref 36.0–46.0)
HEMOGLOBIN: 12 g/dL (ref 12.0–15.0)
MCH: 28.7 pg (ref 26.0–34.0)
MCHC: 33.3 g/dL (ref 30.0–36.0)
MCV: 86.1 fL (ref 78.0–100.0)
PLATELETS: 369 10*3/uL (ref 150–400)
RBC: 4.18 MIL/uL (ref 3.87–5.11)
RDW: 13 % (ref 11.5–15.5)
WBC: 16.3 10*3/uL — ABNORMAL HIGH (ref 4.0–10.5)

## 2015-04-11 LAB — BLOOD GAS, ARTERIAL
ACID-BASE EXCESS: 1.9 mmol/L (ref 0.0–2.0)
BICARBONATE: 25.8 meq/L — AB (ref 20.0–24.0)
Drawn by: 36496
FIO2: 0.28
O2 Saturation: 94.2 %
PATIENT TEMPERATURE: 98.6
PCO2 ART: 39.6 mmHg (ref 35.0–45.0)
TCO2: 27 mmol/L (ref 0–100)
pH, Arterial: 7.429 (ref 7.350–7.450)
pO2, Arterial: 71.2 mmHg — ABNORMAL LOW (ref 80.0–100.0)

## 2015-04-11 LAB — URINE MICROSCOPIC-ADD ON: RBC / HPF: NONE SEEN RBC/hpf (ref 0–5)

## 2015-04-11 LAB — BASIC METABOLIC PANEL
Anion gap: 9 (ref 5–15)
BUN: 5 mg/dL — AB (ref 6–20)
CALCIUM: 7.7 mg/dL — AB (ref 8.9–10.3)
CHLORIDE: 106 mmol/L (ref 101–111)
CO2: 25 mmol/L (ref 22–32)
CREATININE: 0.42 mg/dL — AB (ref 0.44–1.00)
GFR calc non Af Amer: >60 mL/min (ref >60)
GLUCOSE: 160 mg/dL — AB (ref 65–99)
Potassium: 3.6 mmol/L (ref 3.5–5.1)
Sodium: 140 mmol/L (ref 135–145)

## 2015-04-11 LAB — URINALYSIS, ROUTINE W REFLEX MICROSCOPIC
BILIRUBIN URINE: NEGATIVE
GLUCOSE, UA: NEGATIVE mg/dL
HGB URINE DIPSTICK: NEGATIVE
KETONES UR: NEGATIVE mg/dL
NITRITE: NEGATIVE
PH: 6 (ref 5.0–8.0)
Protein, ur: NEGATIVE mg/dL
Specific Gravity, Urine: 1.006 (ref 1.005–1.030)

## 2015-04-11 MED ORDER — ALBUTEROL SULFATE (2.5 MG/3ML) 0.083% IN NEBU
2.5000 mg | INHALATION_SOLUTION | RESPIRATORY_TRACT | Status: DC
  Administered 2015-04-11: 2.5 mg via RESPIRATORY_TRACT
  Filled 2015-04-11: qty 3

## 2015-04-11 MED ORDER — ALBUTEROL SULFATE (2.5 MG/3ML) 0.083% IN NEBU: 2.5000 mg | INHALATION_SOLUTION | RESPIRATORY_TRACT | Status: DC | PRN

## 2015-04-11 NOTE — Discharge Instructions (Signed)
Thoracoscopy, Care After °Refer to this sheet in the next few weeks. These instructions provide you with information about caring for yourself after your procedure. Your health care provider may also give you more specific instructions. Your treatment has been planned according to current medical practices, but problems sometimes occur. Call your health care provider if you have any problems or questions after your procedure. °WHAT TO EXPECT AFTER THE PROCEDURE: °After your procedure, it is common to feel sore for up to two weeks. °HOME CARE INSTRUCTIONS °· There are many different ways to close and cover an incision, including stitches (sutures), skin glue, and adhesive strips. Follow your health care provider's instructions about: °¨ Incision care. °¨ Bandage (dressing) changes and removal. °¨ Incision closure removal. °· Check your incision area every day for signs of infection. Watch for: °¨ Redness, swelling, or pain. °¨ Fluid, blood, or pus. °· Take medicines only as directed by your health care provider. °· Try to cough often. Coughing helps to protect against lung infection (pneumonia). It may hurt to cough. If this happens, hold a pillow against your chest when you cough. °· Take deep breaths. This also helps to protect against pneumonia. °· If you were given an incentive spirometer, use it as directed by your health care provider. °· Do not take baths, swim, or use a hot tub until your health care provider approves. You may take showers. °· Avoid lifting until your health care provider approves. °· Avoid driving until your health care provider approves. °· Do not travel by airplane after the chest tube is removed until your health care provider approves. °SEEK MEDICAL CARE IF: °· You have a fever. °· Pain medicines do not ease your pain. °· You have redness, swelling, or increasing pain in your incision area. °· You develop a cough that does not go away, or you are coughing up mucus that is yellow or  green. °SEEK IMMEDIATE MEDICAL CARE IF: °· You have fluid, blood, or pus coming from your incision. °· There is a bad smell coming from your incision or dressing. °· You develop a rash. °· You have difficulty breathing. °· You cough up blood. °· You develop light-headedness or you feel faint. °· You develop chest pain. °· Your heartbeat feels irregular or very fast. °  °This information is not intended to replace advice given to you by your health care provider. Make sure you discuss any questions you have with your health care provider. °  °Document Released: 09/04/2004 Document Revised: 03/08/2014 Document Reviewed: 10/31/2013 °Elsevier Interactive Patient Education ©2016 [employer] Inc[employer] ° °

## 2015-04-11 NOTE — Op Note (Signed)
NAMEZAYANNA, PUNDT NO.:  [id number]  MEDICAL RECORD NO.:  [id number]  LOCATION:  3S12C                        FACILITY:  [facility]  PHYSICIAN:  Kerin Perna, M.D.  DATE OF BIRTH:  Jul 16, 1965  DATE OF PROCEDURE:  04/10/2015 DATE OF DISCHARGE:                              OPERATIVE REPORT   OPERATION:  Right VATS (video-assisted thoracoscopic surgery) with drainage of empyema and decortication of right lower lobe.  SURGEON:  Kerin Perna, MD  ASSISTANT:  Rowe Clack, PA-C  ANESTHESIA:  General.  PREOPERATIVE DIAGNOSIS:  Right lower lobe pneumonia with empyema and entrapment of the right lower lobe.  POSTOPERATIVE DIAGNOSIS:  Right lower lobe pneumonia with empyema and entrapment of the right lower lobe.  CLINICAL NOTE:  The patient is a very nice 50 year old, [place] female with several days history of right chest pain, shortness of breath, cough, fever, malaise, loss of appetite, weight loss, and weakness.  She presented to the hospital where chest x-ray showed a right lower lobe infiltrate and a fusion.  She was started on antibiotics.  She clinically worsened with fever and increasing pain and a CT scan showed significant increase in the pleural effusion which appeared to be an empyema.  I discussed the patient with the provider at the [facility] and recommended transfer to [facility] for admission and probable thoracic surgery.  After the patient arrived, I examined the patient in her hospital room and reviewed the results of the CT scan with the patient and her family. I discussed the diagnosis of right empyema and right lower lobe pneumonia and the recommended treatment of right VATS for drainage and decortication of the right lung.  I discussed with the patient and family, the major aspects of the operation including the use of general anesthesia, location of the surgical incisions, and the expected postoperative recovery.   This discussion was translated to the patient through her son who spoke both [place] and [place] very well.  I discussed the risk to her of the operation and the risks of not proceeding with surgery.  She understood the risks of bleeding, prolonged air leak, recurrent infection, multisystem failure, and death. She agreed to proceed with surgery under what I felt was an informed consent.  OPERATIVE FINDINGS:  Severe dense obliteration of the pleural space from loculated effusion, fibrinous exudate and a moderately thick peel on the right lower lobe which was indurated and chronically inflamed-infected.  OPERATIVE PROCEDURE:  The patient was brought to the operating room and placed supine on operating room table.  General anesthesia was induced under invasive hemodynamic monitoring.  The patient was intubated with a double-lumen endotracheal tube positioned by the anesthesiologist and rolled right side up.  The right chest was prepped and draped as a sterile field.  A proper time-out was performed.  A small incision was made close to the tip of the scapula and the right pleural space entered.  It was obliterated with adhesions and there was no visualization for a scope.  The incision was extended to approximately 6- 7 cm.  The ribs were gently spread.  Slowly with sharp and blunt dissection, the right  pleural space was opened and the lower lobe, middle lobe, and upper lobes were mobilized.  All the loculated effusion was drained.  There was foul-smelling fluid in the loculated area next to the lower lobe above the diaphragm.  This was all cleared out and irrigated with saline.  The moderately thick peel on the right lower lobe was then removed and the lower lobe was decorticated.  The appearance of the lung was somewhat unusual and that had multiple small pustules on the visceral pleura.  For that reason, a small wedge resection was taken and sent for Pathology.  The pleural peel  and the pleural fluid were sent for cultures including routine AFB and fungal and this fluid was also sent for cytology.  The pleural space was irrigated with copious amounts of saline.  A posterior 32-French Blake drain was placed and an angle 36.2 was placed in the costophrenic angle and both tubes were brought out through separate incisions and secured to the skin.  Pericostal #1 Vicryl was placed to reapproximate the ribs.  The lung was re-expanded prior to tying these down.  The chest wall muscle was closed in layers using interrupted #1 Vicryl.  The subcutaneous and skin layers were closed in running Vicryl.  An on-Q catheter was placed beneath the main incision above the chest tube entry sites secured to the skin and connected to a Marcaine 0.5% reservoir.  The patient was turned supine, extubated and returned to recovery room.     Kerin Perna, M.D.     PV/MEDQ  D:  04/10/2015  T:  04/11/2015  Job:  [id number]

## 2015-04-11 NOTE — Progress Notes (Addendum)
TCTS DAILY ICU PROGRESS NOTE                   [address]            [place] [address]          [phone]   1 Day Post-Op Procedure(s) (LRB): RIGHT VIDEO ASSISTED THORACOSCOPY (VATS)/EMPYEMA (Right)  Total Length of Stay:  LOS: 1 day   Subjective: Feels sore on right side  Objective: Vital signs in last 24 hours: Temp:  [97.3 F (36.3 C)-101.1 F (38.4 C)] 97.6 F (36.4 C) (02/10 0743) Pulse Rate:  [77-124] 77 (02/10 0743) Cardiac Rhythm:  [-] Normal sinus rhythm;Supraventricular tachycardia (02/10 0145) Resp:  [18-38] 22 (02/10 0743) BP: (93-133)/(30-91) 98/68 mmHg (02/10 0743) SpO2:  [89 %-100 %] 97 % (02/10 0743) Arterial Line BP: (94-138)/(68-101) 94/77 mmHg (02/10 0515) Weight:  [116 lb (52.617 kg)-119 lb 4.3 oz (54.1 kg)] 119 lb 4.3 oz (54.1 kg) (02/09 2021)  Filed Weights   04/10/15 0941 04/10/15 2021  Weight: 116 lb (52.617 kg) 119 lb 4.3 oz (54.1 kg)    Weight change:    Hemodynamic parameters for last 24 hours:    Intake/Output from previous day: 02/09 0701 - 02/10 0700 In: 250 [IV Piggyback:250] Out: 1585 [Urine:1350; Blood:75; Chest Tube:160]  Intake/Output this shift:    Current Meds: Scheduled Meds: . acetaminophen  1,000 mg Oral 4 times per day   Or  . acetaminophen (TYLENOL) oral liquid 160 mg/5 mL  1,000 mg Oral 4 times per day  . albuterol  2.5 mg Nebulization Q4H WA  . bisacodyl  10 mg Oral Daily  . fentaNYL   Intravenous 6 times per day  . piperacillin-tazobactam (ZOSYN)  IV  3.375 g Intravenous 3 times per day  . senna-docusate  1 tablet Oral QHS  . vancomycin  750 mg Intravenous Q12H   Continuous Infusions: . dextrose 5 % and 0.9% NaCl 10 mL/hr at 04/11/15 0749   PRN Meds:.diphenhydrAMINE **OR** diphenhydrAMINE, naloxone **AND** sodium chloride flush, ondansetron (ZOFRAN) IV, oxyCODONE, potassium chloride, traMADol  General appearance: alert, cooperative and fatigued Heart: regular rate and rhythm Lungs:  dim right lower fields Abdomen: soft Extremities: PAS in place Wound: incis ok  Lab Results: CBC: Recent Labs  04/10/15 1249 04/11/15 0548  WBC 13.8* 16.3*  HGB 11.5* 12.0  HCT 35.2* 36.0  PLT 378 369   BMET:  Recent Labs  04/10/15 1249 04/11/15 0548  NA 142 140  K 3.9 3.6  CL 109 106  CO2 23 25  GLUCOSE 112* 160*  BUN 10 5*  CREATININE 0.50 0.42*  CALCIUM 7.7* 7.7*    PT/INR:  Recent Labs  04/10/15 1249  LABPROT 18.2*  INR 1.50*   Radiology: Dg Chest 2 View  04/10/2015  CLINICAL DATA:  Chest pain for 10 days EXAM: CHEST  2 VIEW COMPARISON:  CT chest 04/06/2015 FINDINGS: There is near complete opacification of the right lung with a small area of aerated lung at the apex medially. There is mild left basilar atelectasis. The left lung is otherwise clear. There is no pneumothorax. The heart and mediastinal contours are unremarkable. The osseous structures are unremarkable. IMPRESSION: Near-complete opacification of the right lung likely reflecting a large pleural effusion with associated atelectasis. Small area of aerated lung at the apex. This has significantly progressed compared with 04/06/2015. Electronically Signed   By: Elige Ko   On: 04/10/2015 09:57   Dg Chest [facility]  04/11/2015  CLINICAL DATA:  Right empyema EXAM: PORTABLE CHEST 1 VIEW COMPARISON:  Portable chest x-ray of April 10, 2015. FINDINGS: The lungs remain borderline hypoinflated. There is persistent increased interstitial density on the right with areas of confluence. The large right empyema demonstrated on the pre-surgical images is no longer evident. There is small amount of pleural fluid along the right lateral thoracic wall. The 2 right-sided chest tubes are in stable position with no evidence of a pneumothorax. There is no mediastinal shift. On the left there is a small pleural effusion. There is minimal left basilar atelectasis. The heart is mildly enlarged but stable. The pulmonary vascularity  is not engorged. IMPRESSION: Fairly stable appearance of the chest since yesterday's study. The support tubes are in reasonable position. Persistent bibasilar atelectasis greater on the right. Persistent small amount of pleural fluid especially laterally in the right hemi thorax. Electronically Signed   By: David  [place] M.D.   On: 04/11/2015 07:48   Dg Chest Port 1 View  04/10/2015  CLINICAL DATA:  Empyema.  VATS procedure. EXAM: PORTABLE CHEST 1 VIEW COMPARISON:  04/10/2015 FINDINGS: Interval placement of 2 right-sided chest tubes is seen, with improvement of the aeration of the right lung. There is a small amount of residual right pleural fluid. Cardiomediastinal silhouette is normal. Mediastinal contours appear intact. There is no evidence of focal airspace consolidation, or pneumothorax. There is probable left lower lobe atelectasis. Linear opacity in the right midlung may represent atelectasis or consolidation. Osseous structures are without acute abnormality. Soft tissues are grossly normal. IMPRESSION: Improvement of right lung aeration, with residual small right pleural effusion, post placement of 2 right-sided chest tubes. Linear opacity in the right midlung may represent area of atelectasis or consolidation. Probable left lower lobe subsegmental atelectasis. Electronically Signed   By: Ted Mcalpine M.D.   On: 04/10/2015 18:26   Results for orders placed or performed during the hospital encounter of 04/10/15  Body fluid culture     Status: None (Preliminary result)   Collection Time: 04/10/15  4:47 PM  Result Value Ref Range Status   Specimen Description FLUID RIGHT PLEURAL  Final   Special Requests FLUID ON SWAB PATIENT ON FOLLOWING VANC AND ZOSYN  Final   Gram Stain   Final    FEW WBC PRESENT,BOTH PMN AND MONONUCLEAR NO ORGANISMS SEEN    Culture PENDING  Incomplete   Report Status PENDING  Incomplete    Assessment/Plan: S/P Procedure(s) (LRB): RIGHT VIDEO ASSISTED THORACOSCOPY  (VATS)/EMPYEMA (Right)  1 stable  2 decrease IV fluids 3 cont CT's to suction , 160 cc out so far today, no air leak 4 some increase in leukocytosis-conts vanc and zosyn, cultures and pat pending 5 pulm toilet/ mobilize as able     GOLD,WAYNE E 04/11/2015 8:25 AM \ Operative cultures remain negative Patient examined and x-ray reviewed personally Agree with above note Continue current therapy Kathlee Nations trigt M.D.

## 2015-04-11 NOTE — Care Management Note (Signed)
Case Management Note  Patient Details  Name: RAKEL JUNIO MRN: [id number] Date of Birth: June 22, 1965  Subjective/Objective:    Patient is s/p VATS, has chest tubes x 2 , iv abx, NCM will cont to follow for dc needs.                Action/Plan:   Expected Discharge Date:                  Expected Discharge Plan:  Home/Self Care  In-House Referral:     Discharge planning Services  CM Consult  Post Acute Care Choice:    Choice offered to:     DME Arranged:    DME Agency:     HH Arranged:    HH Agency:     Status of Service:  In process, will continue to follow  Medicare Important Message Given:    Date Medicare IM Given:    Medicare IM give by:    Date Additional Medicare IM Given:    Additional Medicare Important Message give by:     If discussed at Long Length of Stay Meetings, dates discussed:    Additional Comments:  Leone Haven, RN 04/11/2015, 4:50 PM

## 2015-04-11 NOTE — Discharge Summary (Signed)
Physician Discharge Summary       [address] [place]       [place] [address]             [phone]    Patient ID: Sara Li MRN: [id number] DOB/AGE: 1966/01/28 50 y.o.  Admit date: 04/10/2015 Discharge date: 04/16/2015  Admission Diagnoses: 1. Right lower lobe pneumonia with empyema 2. Entrapment right lower lobe  Procedure (s):  Right VATS (video-assisted thoracoscopic surgery) with drainage of empyema and decortication of right lower lobe by Dr. Donata Clay on 04/10/2015.  Pathology: Results pending  History of Presenting Illness: The patient is a 50 year old [place] female who presented last Friday to the emergency department with chest pain. She has had discomfort for approximately the last 10 days. It is described as pleuritic and located on the right side of her chest and upper back. On presentation to the emergency department on February 5 a CT scan revealed a small right-sided pleural effusion. The pain did progress but was not associated with shortness of breath. She denied fevers, chills, nausea or vomiting. On today's date she re-presented to the emergency department with a low-grade fever, tachycardia, tachypnea and was found to have diminished breath sounds over the entirety of her right lung fields. A chest x-ray showed progression of the right sided effusion. She was also found to have a leukocytosis with white blood cell count of 15 point 9 and normal lactate level. She was started on intravenous vancomycin and Zosyn and was accepted and transferred to [facility] for further management to include video-assisted thoracoscopy for drainage of the effusion/possible empyema. She is a nonsmoker and her past medical history is unremarkable. Dr. Donata Clay discussed the need for right VATS, drain right empyema, decortication on 04/10/2015.  Brief Hospital Course:  She remained afebrile and hemodynamically stable. Chest tube output gradually decreased.  Chest tubes did not have an air leak. Daily chest x ray remained stable. Anterior chest tube was removed on 04/12/2015 and remaining chest tube was removed on 04/15/2015. She remained on Vancomycin and Zosyn. She will be discharged on Augmentin for 2 weeks. She is ambulating on room air. She is tolerating a diet. She is felt surgically stable for discharge today.   Latest Vital Signs: Blood pressure 114/81, pulse 82, temperature 98.1 F (36.7 C), temperature source Oral, resp. rate 25, height  (1.473 m), weight 119 lb 4.3 oz (54.1 kg), last menstrual period 04/06/2015, SpO2 98 %.  Physical Exam: Cardiovascular: RRR Pulmonary: Slightly diminished at right base and left lung clear Abdomen: Soft, non tender, bowel sounds present. Extremities: No lower extremity edema. Wounds: Clean and dry. Minor sero sanguinous ooze from posterior chest tube site  Discharge Condition:Stable and discharged to home  Recent laboratory studies:  Lab Results  Component Value Date   WBC 9.8 04/15/2015   HGB 12.1 04/15/2015   HCT 36.7 04/15/2015   MCV 86.6 04/15/2015   PLT 514* 04/15/2015   Lab Results  Component Value Date   NA 139 04/12/2015   K 3.8 04/12/2015   CL 102 04/12/2015   CO2 26 04/12/2015   CREATININE 0.58 04/12/2015   GLUCOSE 121* 04/12/2015    Diagnostic Studies:  Ct Angio Chest W/cm &/or Wo Cm  04/06/2015  CLINICAL DATA:  Chest pain.  Rule out pulmonary embolism EXAM: CT ANGIOGRAPHY CHEST WITH CONTRAST TECHNIQUE: Multidetector CT imaging of the chest was performed using the standard protocol during bolus administration of intravenous contrast. Multiplanar CT image  reconstructions and MIPs were obtained to evaluate the vascular anatomy. CONTRAST:  OMNIPAQUE IOHEXOL 350 MG/ML SOLN COMPARISON:  None. FINDINGS: Negative for pulmonary embolism. Pulmonary arteries normal in caliber. Heart size normal. No pericardial effusion. Thoracic aorta appears normal. No dissection or aneurysm.  Elevated right hemidiaphragm. Small right pleural effusion. Right lower lobe atelectasis. No effusion on the left. No significant infiltrate on the left. Negative for mass or adenopathy. Elevated right hemidiaphragm with fatty infiltration of the liver. No acute abnormality upper abdomen. Review of the MIP images confirms the above findings. IMPRESSION: Negative for pulmonary embolism Small right effusion and dependent atelectasis right lower lobe with elevation of the right hemidiaphragm. Electronically Signed   By: Marlan Palau M.D.   On: 04/06/2015 16:22   EXAM: CHEST 2 VIEW  COMPARISON: PA and lateral chest x-ray of April 15, 2015  FINDINGS: The left lung is adequately inflated. Stable linear scarring or atelectasis noted in the mid lung and there is minimal subsegmental atelectasis laterally at the left lung base.  On the right chest tube is been removed. There is no pneumothorax or pneumomediastinum. There is stable pleural thickening or pleural fluid along the lateral aspect right pleural surface and extending into the minor fissure. The heart is top-normal in size. The pulmonary vascularity is not engorged. The mediastinum is normal in width. The trachea is midline. The bony thorax is unremarkable.  IMPRESSION: No pneumothorax or significant further accumulation of pleural fluid on the right since chest tube removal. Minimal subsegmental atelectatic changes on the left, stable.   Electronically Signed  By: David Swaziland M.D.  On: 04/16/2015 08:04  Discharge Medications:   Medication List    STOP taking these medications        traMADol 50 MG tablet  Commonly known as:  ULTRAM      TAKE these medications        acetaminophen 500 MG tablet  Commonly known as:  TYLENOL  Take 500 mg by mouth every 6 (six) hours as needed for mild pain.     amoxicillin-clavulanate 875-125 MG tablet  Commonly known as:  AUGMENTIN  Take 1 tablet by mouth every 12 (twelve)  hours.     guaiFENesin 600 MG 12 hr tablet  Commonly known as:  MUCINEX  Take 1 tablet (600 mg total) by mouth 2 (two) times daily as needed for cough.     oxyCODONE 5 MG immediate release tablet  Commonly known as:  Oxy IR/ROXICODONE  Take 1-2 tablets (5-10 mg total) by mouth every 4 (four) hours as needed for severe pain.        Follow Up Appointments: Follow-up Information    Follow up with Kerin Perna III, MD On 04/30/2015.   Specialty:  Cardiothoracic Surgery   Why:  PA/LAT CXR (to be taken at [facility] which is in the same building as Dr. Zenaida Niece Trigt's office) on 04/30/2015 at 12:45 pm;Appointment time is at 1:30 pm   Contact information:   [address] [employer] [address] [place] [address] [phone]       Signed: Doree Fudge MPA-C 04/16/2015, 9:36 AM

## 2015-04-12 ENCOUNTER — Inpatient Hospital Stay (HOSPITAL_COMMUNITY): Payer: BLUE CROSS/BLUE SHIELD

## 2015-04-12 LAB — COMPREHENSIVE METABOLIC PANEL
ALK PHOS: 123 U/L (ref 38–126)
ALT: 97 U/L — AB (ref 14–54)
AST: 41 U/L (ref 15–41)
Albumin: 1.8 g/dL — ABNORMAL LOW (ref 3.5–5.0)
Anion gap: 11 (ref 5–15)
BILIRUBIN TOTAL: 1.3 mg/dL — AB (ref 0.3–1.2)
BUN: 12 mg/dL (ref 6–20)
CALCIUM: 7.9 mg/dL — AB (ref 8.9–10.3)
CHLORIDE: 102 mmol/L (ref 101–111)
CO2: 26 mmol/L (ref 22–32)
CREATININE: 0.58 mg/dL (ref 0.44–1.00)
Glucose, Bld: 121 mg/dL — ABNORMAL HIGH (ref 65–99)
Potassium: 3.8 mmol/L (ref 3.5–5.1)
Sodium: 139 mmol/L (ref 135–145)
Total Protein: 4.7 g/dL — ABNORMAL LOW (ref 6.5–8.1)

## 2015-04-12 LAB — MRSA PCR SCREENING: MRSA by PCR: NEGATIVE

## 2015-04-12 LAB — CBC
HEMATOCRIT: 36.9 % (ref 36.0–46.0)
HEMOGLOBIN: 11.8 g/dL — AB (ref 12.0–15.0)
MCH: 27.9 pg (ref 26.0–34.0)
MCHC: 32 g/dL (ref 30.0–36.0)
MCV: 87.2 fL (ref 78.0–100.0)
PLATELETS: 392 10*3/uL (ref 150–400)
RBC: 4.23 MIL/uL (ref 3.87–5.11)
RDW: 13.2 % (ref 11.5–15.5)
WBC: 14.5 10*3/uL — AB (ref 4.0–10.5)

## 2015-04-12 LAB — VANCOMYCIN, TROUGH: Vancomycin Tr: 7 ug/mL — ABNORMAL LOW (ref 10.0–20.0)

## 2015-04-12 MED ORDER — VANCOMYCIN HCL IN DEXTROSE 750-5 MG/150ML-% IV SOLN
750.0000 mg | Freq: Three times a day (TID) | INTRAVENOUS | Status: DC
  Administered 2015-04-13 – 2015-04-16 (×10): 750 mg via INTRAVENOUS
  Filled 2015-04-12 (×13): qty 150

## 2015-04-12 MED ORDER — POTASSIUM CHLORIDE CRYS ER 20 MEQ PO TBCR
20.0000 meq | EXTENDED_RELEASE_TABLET | Freq: Once | ORAL | Status: AC
  Administered 2015-04-12: 20 meq via ORAL
  Filled 2015-04-12: qty 1

## 2015-04-12 NOTE — Consult Note (Signed)
Pharmacy Antibiotic Note  Sara Li is a 50 y.o. female admitted on 04/10/2015 with fever and chest pain. Pharmacy has been consulted vancomycin and Zosyn dosing for PNA.  Vanc trough low at 7.  Plan: 1) Change vanc to  IV Q8H for calculated trough ~16. 2) Continue Zosyn 3.375g q8 (4 hour infusion)   Height:  (147.3 cm) Weight: 119 lb 4.3 oz (54.1 kg) IBW/kg (Calculated) : 40.9  Temp (24hrs), Avg:98.2 F (36.8 C), Min:97.7 F (36.5 C), Max:98.8 F (37.1 C)   Recent Labs Lab 04/06/15 1650 04/10/15 1005 04/10/15 1012 04/10/15 1249 04/11/15 0548 04/12/15 0616 04/12/15 2224  WBC 13.0* 15.9*  --  13.8* 16.3* 14.5*  --   CREATININE 0.55 0.44  --  0.50 0.42* 0.58  --   LATICACIDVEN  --   --  1.05  --   --   --   --   VANCOTROUGH  --   --   --   --   --   --  7*    Estimated Creatinine Clearance: 61.4 mL/min (by C-G formula based on Cr of 0.58).    No Known Allergies  Antimicrobials this admission: 2/9 Vancomycin >> 2/9 Zosyn >>   Dose adjustments this admission: Vanc  Q12H --> trough 7 --> vanc  Q8H  Microbiology results: 2/9 blood x2 >> NGTD  Thank you for allowing pharmacy to be a part of this patient's care.  Vernard Gambles, PharmD, BCPS  04/12/2015 11:53 PM

## 2015-04-12 NOTE — Progress Notes (Addendum)
      [address]       [place] [address]             [phone]       2 Days Post-Op Procedure(s) (LRB): RIGHT VIDEO ASSISTED THORACOSCOPY (VATS)/EMPYEMA (Right)  Subjective: Patient with some incisional pain this am.  Objective: Vital signs in last 24 hours: Temp:  [97.7 F (36.5 C)-98.5 F (36.9 C)] 98.1 F (36.7 C) (02/11 0730) Pulse Rate:  [51-87] 78 (02/11 0730) Cardiac Rhythm:  [-] Normal sinus rhythm (02/11 0818) Resp:  [13-21] 13 (02/11 0818) BP: (94-108)/(65-75) 94/66 mmHg (02/11 0730) SpO2:  [94 %-98 %] 97 % (02/11 0818)      Intake/Output from previous day: 02/10 0701 - 02/11 0700 In: 250 [IV Piggyback:250] Out: 1155 [Urine:875; Chest Tube:280]   Physical Exam:  Cardiovascular: RRR Pulmonary: Clear on left and diminished right base; no rales, wheezes, or rhonchi. Abdomen: Soft, non tender, bowel sounds present. Extremities: No lower extremity edema. Wounds: Dressing is lean and dry.  Chest Tubes: to suction, no air leak  Lab Results: CBC: Recent Labs  04/11/15 0548 04/12/15 0616  WBC 16.3* 14.5*  HGB 12.0 11.8*  HCT 36.0 36.9  PLT 369 392   BMET:  Recent Labs  04/11/15 0548 04/12/15 0616  NA 140 139  K 3.6 3.8  CL 106 102  CO2 25 26  GLUCOSE 160* 121*  BUN 5* 12  CREATININE 0.42* 0.58  CALCIUM 7.7* 7.9*    PT/INR:  Recent Labs  04/10/15 1249  LABPROT 18.2*  INR 1.50*   ABG:  INR: Will add last result for INR, ABG once components are confirmed Will add last 4 CBG results once components are confirmed  Assessment/Plan:  1. CV - SR in the 70's. 2.  Pulmonary - Chest tubes with 280 cc of output last 24 hours. Chest tubes are to suction. There is no air leak. CXR this am appears stable . Will remove anterior  chest tube.On 2 liters of oxygen via [place]-wean to room air as tolerates.  3. ID-On Vanco and Zosyn for PNA. FLuid cultures show no AFB or yeast or anaerobes. 4. Supplement  potassium  ZIMMERMAN,DONIELLE MPA-C 04/12/2015,8:34 AM Patient seen and examined agree with above Anterior CT out, remaining tubes on water seal  Tierney Behl C. Dorris Fetch, MD [facility] [phone]

## 2015-04-13 ENCOUNTER — Inpatient Hospital Stay (HOSPITAL_COMMUNITY): Payer: BLUE CROSS/BLUE SHIELD

## 2015-04-13 MED ORDER — POTASSIUM CHLORIDE CRYS ER 20 MEQ PO TBCR
20.0000 meq | EXTENDED_RELEASE_TABLET | Freq: Once | ORAL | Status: AC
  Administered 2015-04-13: 20 meq via ORAL
  Filled 2015-04-13: qty 1

## 2015-04-13 MED ORDER — FUROSEMIDE 40 MG PO TABS
40.0000 mg | ORAL_TABLET | Freq: Once | ORAL | Status: AC
  Administered 2015-04-13: 40 mg via ORAL
  Filled 2015-04-13: qty 1

## 2015-04-13 NOTE — Anesthesia Postprocedure Evaluation (Signed)
Anesthesia Post Note  Patient: Sara Li  Procedure(s) Performed: Procedure(s) (LRB): RIGHT VIDEO ASSISTED THORACOSCOPY (VATS)/EMPYEMA (Right)  Patient location during evaluation: PACU Anesthesia Type: General Level of consciousness: awake Pain management: pain level controlled Vital Signs Assessment: post-procedure vital signs reviewed and stable Respiratory status: spontaneous breathing Cardiovascular status: stable Postop Assessment: no signs of nausea or vomiting Anesthetic complications: no    Last Vitals:  Filed Vitals:   04/13/15 0807 04/13/15 0832  BP: 123/77   Pulse: 81   Temp:  36.6 C  Resp: 20     Last Pain:  Filed Vitals:   04/13/15 0833  PainSc: 3                  Jlynn Langille

## 2015-04-13 NOTE — Progress Notes (Addendum)
      [address]       [place] [address]             [phone]       3 Days Post-Op Procedure(s) (LRB): RIGHT VIDEO ASSISTED THORACOSCOPY (VATS)/EMPYEMA (Right)  Subjective: Patient with pain right posterior back this am.  Objective: Vital signs in last 24 hours: Temp:  [98 F (36.7 C)-98.8 F (37.1 C)] 98.2 F (36.8 C) (02/12 0308) Pulse Rate:  [76-94] 81 (02/12 0807) Cardiac Rhythm:  [-] Normal sinus rhythm (02/12 0807) Resp:  [13-24] 20 (02/12 0807) BP: (97-123)/(59-84) 123/77 mmHg (02/12 0807) SpO2:  [90 %-97 %] 95 % (02/12 0807)      Intake/Output from previous day: 02/11 0701 - 02/12 0700 In: 920 [P.O.:720; IV Piggyback:200] Out: 200 [Chest Tube:200]   Physical Exam:  Cardiovascular: RRR Pulmonary: Bilateral crackles Abdomen: Soft, non tender, bowel sounds present. Extremities: No lower extremity edema. Wounds: Dressing is lean and dry. Ecchymosis right middle upper back Chest Tubes: to water seal, no air leak  Lab Results: CBC:  Recent Labs  04/11/15 0548 04/12/15 0616  WBC 16.3* 14.5*  HGB 12.0 11.8*  HCT 36.0 36.9  PLT 369 392   BMET:   Recent Labs  04/11/15 0548 04/12/15 0616  NA 140 139  K 3.6 3.8  CL 106 102  CO2 25 26  GLUCOSE 160* 121*  BUN 5* 12  CREATININE 0.42* 0.58  CALCIUM 7.7* 7.9*    PT/INR:   Recent Labs  04/10/15 1249  LABPROT 18.2*  INR 1.50*   ABG:  INR: Will add last result for INR, ABG once components are confirmed Will add last 4 CBG results once components are confirmed  Assessment/Plan:  1. CV - SR in the 70's. 2.  Pulmonary - Chest tubes with 200 cc of output last 24 hours. Chest tube is to water seal. There is no air leak. CXR this am appears to show pneumothorax, right base thickening/atelectasis and bilateral pleural effusions R>L. Hope to remove remaining chest tube in am if output decreases.On 1 liter of oxygen via [place]-wean to room air as tolerates.  3. ID-On Vanco and  Zosyn for PNA. Fluid cultures show no AFB or yeast or anaerobes. 4. Remove On Q  ZIMMERMAN,DONIELLE MPA-C 04/13/2015,8:31 AM  Patient seen and examined, agree with above Will keep tube one more day to be on the safe side  Viviann Spare C. Dorris Fetch, MD [facility] [phone]

## 2015-04-14 ENCOUNTER — Inpatient Hospital Stay (HOSPITAL_COMMUNITY): Payer: BLUE CROSS/BLUE SHIELD

## 2015-04-14 LAB — BODY FLUID CULTURE: Culture: NO GROWTH

## 2015-04-14 MED ORDER — BOOST / RESOURCE BREEZE PO LIQD
237.0000 mL | Freq: Three times a day (TID) | ORAL | Status: DC
  Administered 2015-04-14 – 2015-04-16 (×5): 1 via ORAL

## 2015-04-14 NOTE — Progress Notes (Signed)
[address]       [place] [address]             [phone]      4 Days Post-Op Procedure(s) (LRB): RIGHT VIDEO ASSISTED THORACOSCOPY (VATS)/EMPYEMA (Right) Subjective: C/O pain right chest  Objective: Vital signs in last 24 hours: Temp:  [97.7 F (36.5 C)-98.6 F (37 C)] 97.8 F (36.6 C) (02/13 0737) Pulse Rate:  [73-84] 79 (02/13 0737) Cardiac Rhythm:  [-] Normal sinus rhythm (02/13 0737) Resp:  [15-26] 16 (02/13 0737) BP: (108-126)/(71-85) 110/71 mmHg (02/13 0737) SpO2:  [92 %-98 %] 96 % (02/13 0737)  Hemodynamic parameters for last 24 hours:    Intake/Output from previous day: 02/12 0701 - 02/13 0700 In: 1610 [P.O.:960; IV Piggyback:650] Out: 110 [Chest Tube:110] Intake/Output this shift:      General appearance: alert, cooperative and no distress Heart: regular rate and rhythm Lungs: min dim in right base Abdomen: benign Extremities: no edema Wound: incis healing well  Lab Results:  Recent Labs  04/12/15 0616  WBC 14.5*  HGB 11.8*  HCT 36.9  PLT 392   BMET:  Recent Labs  04/12/15 0616  NA 139  K 3.8  CL 102  CO2 26  GLUCOSE 121*  BUN 12  CREATININE 0.58  CALCIUM 7.9*    PT/INR: No results for input(s): LABPROT, INR in the last 72 hours. ABG    Component Value Date/Time   PHART 7.429 04/11/2015 0451   HCO3 25.8* 04/11/2015 0451   TCO2 27.0 04/11/2015 0451   O2SAT 94.2 04/11/2015 0451   CBG (last 3)  No results for input(s): GLUCAP in the last 72 hours.  Results for orders placed or performed during the hospital encounter of 04/10/15  Culture, blood (Routine X 2) w Reflex to ID Panel     Status: None (Preliminary result)   Collection Time: 04/10/15 10:05 AM  Result Value Ref Range Status   Specimen Description BLOOD RIGHT ANTECUBITAL  Final   Special Requests BOTTLES DRAWN AEROBIC AND ANAEROBIC 5CC  Final   Culture   Final    NO GROWTH 3 DAYS Performed at [facility]    Report Status PENDING   Incomplete  Culture, blood (Routine X 2) w Reflex to ID Panel     Status: None (Preliminary result)   Collection Time: 04/10/15 10:20 AM  Result Value Ref Range Status   Specimen Description BLOOD RIGHT HAND  Final   Special Requests BOTTLES DRAWN AEROBIC AND ANAEROBIC 5CC  Final   Culture   Final    NO GROWTH 3 DAYS Performed at [facility]    Report Status PENDING  Incomplete  Fungus Culture with Smear     Status: None (Preliminary result)   Collection Time: 04/10/15  4:47 PM  Result Value Ref Range Status   Specimen Description FLUID RIGHT PLEURAL  Final   Special Requests FLUID ON SWAB PATIENT ON FOLLOWING VANC AND ZOSYN  Final   Fungal Smear   Final    NO YEAST OR FUNGAL ELEMENTS SEEN Performed at [facility]    Culture   Final    CULTURE IN PROGRESS FOR FOUR WEEKS Performed at [facility]    Report Status PENDING  Incomplete  AFB culture with smear (NOT at [facility])     Status: None (Preliminary result)   Collection Time: 04/10/15  4:47 PM  Result Value Ref Range Status   Specimen Description FLUID RIGHT PLEURAL  Final   Special Requests FLUID ON SWAB PATIENT ON FOLLOWING VANC AND ZOSYN  Final   Acid Fast Smear   Final    NO ACID FAST BACILLI SEEN Performed at [facility]    Culture   Final    CULTURE WILL BE EXAMINED FOR 6 WEEKS BEFORE ISSUING A FINAL REPORT Performed at [facility]    Report Status PENDING  Incomplete  Anaerobic culture     Status: None (Preliminary result)   Collection Time: 04/10/15  4:47 PM  Result Value Ref Range Status   Specimen Description FLUID RIGHT PLEURAL  Final   Special Requests FLUID ON SWAB PATIENT ON FOLLOWING VANC AND ZOSYN  Final   Gram Stain   Final    FEW WBC PRESENT,BOTH PMN AND MONONUCLEAR NO ORGANISMS SEEN    Culture   Final    NO ANAEROBES ISOLATED; CULTURE IN PROGRESS FOR 5 DAYS   Report Status PENDING  Incomplete  Fungus Culture with Smear     Status: None (Preliminary result)    Collection Time: 04/10/15  4:47 PM  Result Value Ref Range Status   Specimen Description TISSUE RIGHT LUNG  Final   Special Requests PATIENT ON FOLLOWING VANC AND ZOSYN  Final   Fungal Smear   Final    NO YEAST OR FUNGAL ELEMENTS SEEN Performed at [facility]    Culture   Final    CULTURE IN PROGRESS FOR FOUR WEEKS Performed at [facility]    Report Status PENDING  Incomplete  AFB culture with smear (NOT at [facility])     Status: None (Preliminary result)   Collection Time: 04/10/15  4:47 PM  Result Value Ref Range Status   Specimen Description TISSUE RIGHT LUNG  Final   Special Requests PATIENT ON FOLLOWING VANC AND ZOSYN  Final   Acid Fast Smear   Final    NO ACID FAST BACILLI SEEN Performed at [facility]    Culture   Final    CULTURE WILL BE EXAMINED FOR 6 WEEKS BEFORE ISSUING A FINAL REPORT Performed at [facility]    Report Status PENDING  Incomplete  Tissue culture     Status: None (Preliminary result)   Collection Time: 04/10/15  4:47 PM  Result Value Ref Range Status   Specimen Description TISSUE RIGHT LUNG  Final   Special Requests PATIENT ON FOLLOWING VANC AND ZOSYN  Final   Gram Stain   Final    MODERATE WBC PRESENT, PREDOMINANTLY PMN RARE SQUAMOUS EPITHELIAL CELLS PRESENT FEW GRAM POSITIVE COCCI IN PAIRS Performed at [facility]    Culture   Final    NO GROWTH 1 DAY Performed at [facility]    Report Status PENDING  Incomplete  Body fluid culture     Status: None (Preliminary result)   Collection Time: 04/10/15  4:47 PM  Result Value Ref Range Status   Specimen Description FLUID RIGHT PLEURAL  Final   Special Requests FLUID ON SWAB PATIENT ON FOLLOWING VANC AND ZOSYN  Final   Gram Stain   Final    FEW WBC PRESENT,BOTH PMN AND MONONUCLEAR NO ORGANISMS SEEN    Culture NO GROWTH 3 DAYS  Final   Report Status PENDING  Incomplete  MRSA PCR Screening     Status: None   Collection Time: 04/12/15  6:30 AM    Result Value Ref Range Status   MRSA by PCR NEGATIVE NEGATIVE Final    Comment:  The GeneXpert MRSA Assay (FDA approved for NASAL specimens only), is one component of a comprehensive MRSA colonization surveillance program. It is not intended to diagnose MRSA infection nor to guide or monitor treatment for MRSA infections.      Meds Scheduled Meds: . acetaminophen  1,000 mg Oral 4 times per day   Or  . acetaminophen (TYLENOL) oral liquid 160 mg/5 mL  1,000 mg Oral 4 times per day  . bisacodyl  10 mg Oral Daily  . fentaNYL   Intravenous 6 times per day  . piperacillin-tazobactam (ZOSYN)  IV  3.375 g Intravenous 3 times per day  . senna-docusate  1 tablet Oral QHS  . vancomycin  750 mg Intravenous Q8H   Continuous Infusions: . dextrose 5 % and 0.9% NaCl 10 mL/hr at 04/11/15 0749   PRN Meds:.albuterol, diphenhydrAMINE **OR** diphenhydrAMINE, naloxone **AND** sodium chloride flush, ondansetron (ZOFRAN) IV, oxyCODONE, potassium chloride, traMADol  Xrays Dg Chest Port 1 View  04/14/2015  CLINICAL DATA:  Pneumothorax. EXAM: PORTABLE CHEST 1 VIEW COMPARISON:  April 13, 2015. FINDINGS: Stable cardiomediastinal silhouette. Right-sided chest tube is unchanged in position. No definite pneumothorax is noted. Stable right-sided loculated right pleural effusion is noted with associated right basilar opacity most consistent with atelectasis or infiltrate. Minimal left pleural effusion is noted with probable associated atelectasis. Bony thorax is unremarkable. IMPRESSION: Stable right-sided loculated right pleural effusion with probable associated right basilar atelectasis or infiltrate. Stable minimal left pleural effusion with associated atelectasis. Electronically Signed   By: Lupita Raider, M.D.   On: 04/14/2015 08:04   Dg Chest Port 1 View  04/13/2015  CLINICAL DATA:  Pneumothorax.  Right-sided chest tube in place. EXAM: PORTABLE CHEST 1 VIEW COMPARISON:  04/12/2015; 04/10/2015;  04/06/2015; chest CT -04/06/2015 FINDINGS: Grossly unchanged cardiac silhouette and mediastinal contours. Interval removal of right basilar chest tube. Apically directed right-sided chest tube remains is unchanged in positioning. No pneumothorax. Unchanged small to moderate-sized partially loculated right-sided effusion with fluid again seen tracking within the right minor fissure. Right mid and lower lung heterogeneous / consolidative opacities are unchanged. Unchanged small left-sided effusion with associated left basilar heterogeneous opacities. The pulmonary vasculature is indistinct with cephalization of flow. Unchanged bones. IMPRESSION: 1. Interval removal of right basilar chest tube. Otherwise, stable positioning of remaining support apparatus. No pneumothorax. 2. Grossly unchanged moderate-sized partially loculated right-sided effusion with associated right mid and lower lung heterogeneous opacities atelectasis versus infiltrate. 3. Unchanged small left-sided effusion with associated left basilar opacities, likely atelectasis. Electronically Signed   By: Simonne Come M.D.   On: 04/13/2015 08:45    Assessment/Plan: S/P Procedure(s) (LRB): RIGHT VIDEO ASSISTED THORACOSCOPY (VATS)/EMPYEMA (Right)  1 Cxr fairly stable in appearance , cont CT to maximize drainage which is decreasing 2 path is pending 3 micro not showing growth 4 cont current abx 5 cont analgesia 6 mobilize as able 7 routine pulm toilet    LOS: 4 days    GOLD,WAYNE E 04/14/2015

## 2015-04-15 ENCOUNTER — Inpatient Hospital Stay (HOSPITAL_COMMUNITY): Payer: BLUE CROSS/BLUE SHIELD

## 2015-04-15 LAB — CULTURE, BLOOD (ROUTINE X 2)
CULTURE: NO GROWTH
CULTURE: NO GROWTH

## 2015-04-15 LAB — CBC
HCT: 36.7 % (ref 36.0–46.0)
Hemoglobin: 12.1 g/dL (ref 12.0–15.0)
MCH: 28.5 pg (ref 26.0–34.0)
MCHC: 33 g/dL (ref 30.0–36.0)
MCV: 86.6 fL (ref 78.0–100.0)
Platelets: 514 10*3/uL — ABNORMAL HIGH (ref 150–400)
RBC: 4.24 MIL/uL (ref 3.87–5.11)
RDW: 13.6 % (ref 11.5–15.5)
WBC: 9.8 10*3/uL (ref 4.0–10.5)

## 2015-04-15 LAB — TISSUE CULTURE: Culture: NO GROWTH

## 2015-04-15 MED ORDER — ACETAMINOPHEN 325 MG PO TABS
650.0000 mg | ORAL_TABLET | Freq: Four times a day (QID) | ORAL | Status: DC | PRN
  Filled 2015-04-15: qty 2

## 2015-04-15 NOTE — Consult Note (Signed)
Pharmacy Antibiotic Note  Sara Li is a 50 y.o. female admitted on 04/10/2015 with fever and chest pain. Pharmacy has been consulted vancomycin and Zosyn dosing for PNA / empyema.  All cultures showing no growth so far. Pt is s/p VATS with chest tube to drain empyema.  Plan: -Continue zosyn 3.375 g IV q8h -Recommend discontinuing vancomycin, for now continue 750/8h   Height:  (147.3 cm) Weight: 119 lb 4.3 oz (54.1 kg) IBW/kg (Calculated) : 40.9  Temp (24hrs), Avg:98 F (36.7 C), Min:97.8 F (36.6 C), Max:98.3 F (36.8 C)   Recent Labs Lab 04/10/15 1005 04/10/15 1012 04/10/15 1249 04/11/15 0548 04/12/15 0616 04/12/15 2224 04/15/15 0527  WBC 15.9*  --  13.8* 16.3* 14.5*  --  9.8  CREATININE 0.44  --  0.50 0.42* 0.58  --   --   LATICACIDVEN  --  1.05  --   --   --   --   --   VANCOTROUGH  --   --   --   --   --  7*  --     Estimated Creatinine Clearance: 61.4 mL/min (by C-G formula based on Cr of 0.58).    No Known Allergies  Antimicrobials this admission: 2/9 Vancomycin >> 2/9 Zosyn >>   Dose adjustments this admission: Vanc  Q12H --> trough 7 --> vanc  Q8H  Microbiology results: 2/9 blood x2 >> NGTD  Thank you for allowing pharmacy to be a part of this patient's care.   Agapito Games, PharmD, BCPS Clinical Pharmacist Pager: [phone] 04/15/2015 1:19 PM

## 2015-04-15 NOTE — Progress Notes (Addendum)
[address]       [place] [address]             [phone]      5 Days Post-Op Procedure(s) (LRB): RIGHT VIDEO ASSISTED THORACOSCOPY (VATS)/EMPYEMA (Right) Subjective:  Feeling better  Objective: Vital signs in last 24 hours: Temp:  [97.8 F (36.6 C)-98.3 F (36.8 C)] 97.9 F (36.6 C) (02/14 0737) Pulse Rate:  [64-84] 66 (02/14 0416) Cardiac Rhythm:  [-] Normal sinus rhythm (02/14 0416) Resp:  [15-23] 17 (02/14 0416) BP: (109-147)/(73-96) 109/78 mmHg (02/14 0416) SpO2:  [96 %-100 %] 98 % (02/14 0416)  Hemodynamic parameters for last 24 hours:    Intake/Output from previous day: 02/13 0701 - 02/14 0700 In: 450 [IV Piggyback:450] Out: 20 [Chest Tube:20] Intake/Output this shift:    General appearance: alert, cooperative and no distress Heart: regular rate and rhythm Lungs: dim on right Abdomen: benign Extremities: no edema Wound: incis healing well  Lab Results:  Recent Labs  04/15/15 0527  WBC 9.8  HGB 12.1  HCT 36.7  PLT 514*   BMET: No results for input(s): NA, K, CL, CO2, GLUCOSE, BUN, CREATININE, CALCIUM in the last 72 hours.  PT/INR: No results for input(s): LABPROT, INR in the last 72 hours. ABG    Component Value Date/Time   PHART 7.429 04/11/2015 0451   HCO3 25.8* 04/11/2015 0451   TCO2 27.0 04/11/2015 0451   O2SAT 94.2 04/11/2015 0451   CBG (last 3)  No results for input(s): GLUCAP in the last 72 hours.  Recent Results (from the past 240 hour(s))  Culture, blood (Routine X 2) w Reflex to ID Panel     Status: None (Preliminary result)   Collection Time: 04/10/15 10:05 AM  Result Value Ref Range Status   Specimen Description BLOOD RIGHT ANTECUBITAL  Final   Special Requests BOTTLES DRAWN AEROBIC AND ANAEROBIC 5CC  Final   Culture   Final    NO GROWTH 4 DAYS Performed at [facility]    Report Status PENDING  Incomplete  Culture, blood (Routine X 2) w Reflex to ID Panel     Status: None (Preliminary  result)   Collection Time: 04/10/15 10:20 AM  Result Value Ref Range Status   Specimen Description BLOOD RIGHT HAND  Final   Special Requests BOTTLES DRAWN AEROBIC AND ANAEROBIC 5CC  Final   Culture   Final    NO GROWTH 4 DAYS Performed at [facility]    Report Status PENDING  Incomplete  Fungus Culture with Smear     Status: None (Preliminary result)   Collection Time: 04/10/15  4:47 PM  Result Value Ref Range Status   Specimen Description FLUID RIGHT PLEURAL  Final   Special Requests FLUID ON SWAB PATIENT ON FOLLOWING VANC AND ZOSYN  Final   Fungal Smear   Final    NO YEAST OR FUNGAL ELEMENTS SEEN Performed at [facility]    Culture   Final    CULTURE IN PROGRESS FOR FOUR WEEKS Performed at [facility]    Report Status PENDING  Incomplete  AFB culture with smear (NOT at [facility])     Status: None (Preliminary result)   Collection Time: 04/10/15  4:47 PM  Result Value Ref Range Status   Specimen Description FLUID RIGHT PLEURAL  Final   Special Requests FLUID ON SWAB PATIENT ON FOLLOWING VANC AND ZOSYN  Final   Acid Fast Smear   Final  NO ACID FAST BACILLI SEEN Performed at [facility]    Culture   Final    CULTURE WILL BE EXAMINED FOR 6 WEEKS BEFORE ISSUING A FINAL REPORT Performed at [facility]    Report Status PENDING  Incomplete  Anaerobic culture     Status: None (Preliminary result)   Collection Time: 04/10/15  4:47 PM  Result Value Ref Range Status   Specimen Description FLUID RIGHT PLEURAL  Final   Special Requests FLUID ON SWAB PATIENT ON FOLLOWING VANC AND ZOSYN  Final   Gram Stain   Final    FEW WBC PRESENT,BOTH PMN AND MONONUCLEAR NO ORGANISMS SEEN    Culture   Final    NO ANAEROBES ISOLATED; CULTURE IN PROGRESS FOR 5 DAYS   Report Status PENDING  Incomplete  Fungus Culture with Smear     Status: None (Preliminary result)   Collection Time: 04/10/15  4:47 PM  Result Value Ref Range Status   Specimen  Description TISSUE RIGHT LUNG  Final   Special Requests PATIENT ON FOLLOWING VANC AND ZOSYN  Final   Fungal Smear   Final    NO YEAST OR FUNGAL ELEMENTS SEEN Performed at [facility]    Culture   Final    CULTURE IN PROGRESS FOR FOUR WEEKS Performed at [facility]    Report Status PENDING  Incomplete  AFB culture with smear (NOT at [facility])     Status: None (Preliminary result)   Collection Time: 04/10/15  4:47 PM  Result Value Ref Range Status   Specimen Description TISSUE RIGHT LUNG  Final   Special Requests PATIENT ON FOLLOWING VANC AND ZOSYN  Final   Acid Fast Smear   Final    NO ACID FAST BACILLI SEEN Performed at [facility]    Culture   Final    CULTURE WILL BE EXAMINED FOR 6 WEEKS BEFORE ISSUING A FINAL REPORT Performed at [facility]    Report Status PENDING  Incomplete  Tissue culture     Status: None (Preliminary result)   Collection Time: 04/10/15  4:47 PM  Result Value Ref Range Status   Specimen Description TISSUE RIGHT LUNG  Final   Special Requests PATIENT ON FOLLOWING VANC AND ZOSYN  Final   Gram Stain   Final    MODERATE WBC PRESENT, PREDOMINANTLY PMN RARE SQUAMOUS EPITHELIAL CELLS PRESENT FEW GRAM POSITIVE COCCI IN PAIRS Performed at [facility]    Culture   Final    NO GROWTH 2 DAYS Performed at [facility]    Report Status PENDING  Incomplete  Body fluid culture     Status: None   Collection Time: 04/10/15  4:47 PM  Result Value Ref Range Status   Specimen Description FLUID RIGHT PLEURAL  Final   Special Requests FLUID ON SWAB PATIENT ON FOLLOWING VANC AND ZOSYN  Final   Gram Stain   Final    FEW WBC PRESENT,BOTH PMN AND MONONUCLEAR NO ORGANISMS SEEN    Culture NO GROWTH 3 DAYS  Final   Report Status 04/14/2015 FINAL  Final  MRSA PCR Screening     Status: None   Collection Time: 04/12/15  6:30 AM  Result Value Ref Range Status   MRSA by PCR NEGATIVE NEGATIVE Final    Comment:        The  GeneXpert MRSA Assay (FDA approved for NASAL specimens only), is one component of a comprehensive MRSA colonization surveillance program. It is  not intended to diagnose MRSA infection nor to guide or monitor treatment for MRSA infections.    PATHOLOGYINAL DIAGNOSIS Diagnosis Pathology: 1. Pleura, peel, Right - ACUTE INFLAMMATION AND ABUNDANT FIBRIN DEPOSITION. - NO EVIDENCE OF MALIGNANCY. 2. Lung, wedge biopsy/resection, Right middle lobe - ACUTE PLEURAL INFLAMMATION WITH ABUNDANT FIBRIN DEPOSITION. - NO EVIDENCE OF MALIGNANCY    Meds Scheduled Meds: . acetaminophen  1,000 mg Oral 4 times per day   Or  . acetaminophen (TYLENOL) oral liquid 160 mg/5 mL  1,000 mg Oral 4 times per day  . bisacodyl  10 mg Oral Daily  . feeding supplement  237 mL Oral TID WC  . fentaNYL   Intravenous 6 times per day  . piperacillin-tazobactam (ZOSYN)  IV  3.375 g Intravenous 3 times per day  . senna-docusate  1 tablet Oral QHS  . vancomycin  750 mg Intravenous Q8H   Continuous Infusions: . dextrose 5 % and 0.9% NaCl 10 mL/hr at 04/11/15 0749   PRN Meds:.albuterol, diphenhydrAMINE **OR** diphenhydrAMINE, naloxone **AND** sodium chloride flush, ondansetron (ZOFRAN) IV, oxyCODONE, potassium chloride, traMADol  Xrays Dg Chest 2 View  04/15/2015  CLINICAL DATA:  Right-sided chest tube, followup EXAM: CHEST  2 VIEW COMPARISON:  Portable chest x-ray of 04/14/2015 FINDINGS: There is little change in pleural and parenchymal opacity at the right lung base with right chest tube remaining. No pneumothorax is seen. Cardiomegaly is stable. IMPRESSION: No change in pleural and parenchymal opacity at the right lung base. Right chest tube remains. Electronically Signed   By: Dwyane Dee M.D.   On: 04/15/2015 08:13   Dg Chest Port 1 View  04/14/2015  CLINICAL DATA:  Pneumothorax. EXAM: PORTABLE CHEST 1 VIEW COMPARISON:  April 13, 2015. FINDINGS: Stable cardiomediastinal silhouette. Right-sided chest tube is  unchanged in position. No definite pneumothorax is noted. Stable right-sided loculated right pleural effusion is noted with associated right basilar opacity most consistent with atelectasis or infiltrate. Minimal left pleural effusion is noted with probable associated atelectasis. Bony thorax is unremarkable. IMPRESSION: Stable right-sided loculated right pleural effusion with probable associated right basilar atelectasis or infiltrate. Stable minimal left pleural effusion with associated atelectasis. Electronically Signed   By: Lupita Raider, M.D.   On: 04/14/2015 08:04    Assessment/Plan: S/P Procedure(s) (LRB): RIGHT VIDEO ASSISTED THORACOSCOPY (VATS)/EMPYEMA (Right)  1 scant drainage at this point, probably ok to d/c chest tube 2 cont IV abx- change to po soon 3 path unremarkable 4 no growth on cultures 5 making good clinical progress     LOS: 5 days    GOLD,WAYNE E 04/15/2015  Remove final chest tube today Plan on patient home in a.m. if chest x-ray satisfactory 2 weeks of oral Augmentin  patient examined and medical record reviewed,agree with above note. Kathlee Nations Trigt III 04/15/2015

## 2015-04-16 ENCOUNTER — Inpatient Hospital Stay (HOSPITAL_COMMUNITY): Payer: BLUE CROSS/BLUE SHIELD

## 2015-04-16 LAB — ANAEROBIC CULTURE

## 2015-04-16 MED ORDER — AMOXICILLIN-POT CLAVULANATE 875-125 MG PO TABS
1.0000 | ORAL_TABLET | Freq: Two times a day (BID) | ORAL | Status: DC
  Administered 2015-04-16: 1 via ORAL
  Filled 2015-04-16 (×2): qty 1

## 2015-04-16 MED ORDER — AMOXICILLIN-POT CLAVULANATE 875-125 MG PO TABS: 1.0000 | ORAL_TABLET | Freq: Two times a day (BID) | ORAL | Status: DC

## 2015-04-16 MED ORDER — GUAIFENESIN ER 600 MG PO TB12
600.0000 mg | ORAL_TABLET | Freq: Two times a day (BID) | ORAL | Status: DC
  Administered 2015-04-16: 600 mg via ORAL
  Filled 2015-04-16: qty 1

## 2015-04-16 MED ORDER — GUAIFENESIN ER 600 MG PO TB12: 600.0000 mg | ORAL_TABLET | Freq: Two times a day (BID) | ORAL | Status: DC | PRN

## 2015-04-16 MED ORDER — OXYCODONE HCL 5 MG PO TABS: 5.0000 mg | ORAL_TABLET | ORAL | Status: DC | PRN

## 2015-04-16 NOTE — Care Management Note (Signed)
Case Management Note  Patient Details  Name: Sara Li MRN: [id number] Date of Birth: [birth year]  Subjective/Objective:  Patient is for dc today, no needs.                  Action/Plan:   Expected Discharge Date:                  Expected Discharge Plan:  Home/Self Care  In-House Referral:     Discharge planning Services  CM Consult  Post Acute Care Choice:    Choice offered to:     DME Arranged:    DME Agency:     HH Arranged:    HH Agency:     Status of Service:  Completed, signed off  Medicare Important Message Given:    Date Medicare IM Given:    Medicare IM give by:    Date Additional Medicare IM Given:    Additional Medicare Important Message give by:     If discussed at Long Length of Stay Meetings, dates discussed:    Additional Comments:  Leone Haven, RN 04/16/2015, 10:17 AM

## 2015-04-16 NOTE — Progress Notes (Signed)
Discharge instructions given. No questions or concerns at this time. D/C'd IV. Tip intact. Pt tolerated well. Placed dressing to posterior chest tube site. Pt d/c'd via family vehicle.

## 2015-04-16 NOTE — Progress Notes (Addendum)
      [address]       [place] [address]             [phone]       6 Days Post-Op Procedure(s) (LRB): RIGHT VIDEO ASSISTED THORACOSCOPY (VATS)/EMPYEMA (Right)  Subjective: Daughter at bedside and is translating. Patient feels better now that all chest tubes have been removed.  Objective: Vital signs in last 24 hours: Temp:  [97.5 F (36.4 C)-98.5 F (36.9 C)] 98.1 F (36.7 C) (02/15 0834) Pulse Rate:  [82-93] 82 (02/15 0416) Cardiac Rhythm:  [-] Normal sinus rhythm (02/15 0822) Resp:  [25-30] 25 (02/15 0416) BP: (114-122)/(75-86) 114/81 mmHg (02/15 0416) SpO2:  [98 %-100 %] 98 % (02/15 0416)      Intake/Output from previous day: 02/14 0701 - 02/15 0700 In: 810 [P.O.:360; IV Piggyback:450] Out: 0    Physical Exam:  Cardiovascular: RRR Pulmonary: Slightly diminished at right base and left lung clear Abdomen: Soft, non tender, bowel sounds present. Extremities: No lower extremity edema. Wounds: Clean and dry. Minor sero sanguinous ooze from posterior chest tube site   Lab Results: CBC:  Recent Labs  04/15/15 0527  WBC 9.8  HGB 12.1  HCT 36.7  PLT 514*   BMET:  No results for input(s): NA, K, CL, CO2, GLUCOSE, BUN, CREATININE, CALCIUM in the last 72 hours.  PT/INR:  No results for input(s): LABPROT, INR in the last 72 hours. ABG:  INR: Will add last result for INR, ABG once components are confirmed Will add last 4 CBG results once components are confirmed  Assessment/Plan:  1. CV - SR in the 70's. 2.  Pulmonary - Last chest tube removed yesterday. On room air. CXR this am appears to show pneumothorax, right base atelectasis. Encourage incentive spirometer.  3. ID-On Vanco and Zosyn for PNA. Fluid cultures show no AFB or yeast or anaerobes. Will stop IV antibiotics and start Augmentin.  4. Discharge home  ZIMMERMAN,DONIELLE MPA-C 04/16/2015,9:22 AM   patient examined and medical record reviewed,agree with above note. Kathlee Nations Trigt III 04/16/2015

## 2015-04-16 NOTE — Progress Notes (Addendum)
Removed Right chest tube without complication at 1750. Patient and daughter verbalized understanding of education and sign/symptoms to notify nurse of. Patient in bed without signs of distress. Call bell in reach

## 2015-04-29 ENCOUNTER — Other Ambulatory Visit: Payer: Self-pay | Admitting: Cardiothoracic Surgery

## 2015-04-29 DIAGNOSIS — J869 Pyothorax without fistula: Secondary | ICD-10-CM

## 2015-04-30 ENCOUNTER — Ambulatory Visit
Admission: RE | Admit: 2015-04-30 | Discharge: 2015-04-30 | Disposition: A | Discharge status: Home / Self Care | Payer: BLUE CROSS/BLUE SHIELD | Source: Ambulatory Visit | Attending: Cardiothoracic Surgery | Admitting: Cardiothoracic Surgery

## 2015-04-30 ENCOUNTER — Encounter: Payer: Self-pay | Admitting: Cardiothoracic Surgery

## 2015-04-30 ENCOUNTER — Ambulatory Visit (INDEPENDENT_AMBULATORY_CARE_PROVIDER_SITE_OTHER): Payer: Self-pay | Admitting: Cardiothoracic Surgery

## 2015-04-30 VITALS — BP 114/80 | HR 84 | Resp 20 | Ht <= 58 in | Wt 119.0 lb

## 2015-04-30 DIAGNOSIS — J869 Pyothorax without fistula: Secondary | ICD-10-CM

## 2015-04-30 NOTE — Progress Notes (Signed)
PCP is No PCP Per Patient Referring Provider is Garlon Hatchet, PA-C  Chief Complaint  Patient presents with  . Routine Post Op    f/u from surgery with CXR, s/p Rt VATS drainage of empyema and decortication of right lower lobe 04/09/14    HPI:one month followup after right VATS for drainage of empyema and decortication right lower lobe. Operative cultures remained negative. Pathology on material removed was benign-inflammatory Patient finish her course of oral Augmentin. 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 Recently she has developed symptoms of upper respiratory infection with cough, sinus congestion, sore throat and possibly low-grade fever.  Chest x-ray taken today shows good reexpansion of the right lung, no significant pleural effusion, mild pleural thickening at the costophrenic angle   No past medical history on file.  Past Surgical History  Procedure Laterality Date  . Video assisted thoracoscopy (vats)/empyema Right 04/10/2015    Procedure: RIGHT VIDEO ASSISTED THORACOSCOPY (VATS)/EMPYEMA;  Surgeon: Kerin Perna, MD;  Location: [facility] OR;  Service: Thoracic;  Laterality: Right;    No family history on file.  Social History Social History  Substance Use Topics  . Smoking status: Never Smoker   . Smokeless tobacco: None  . Alcohol Use: No    Current Outpatient Prescriptions  Medication Sig Dispense Refill  . acetaminophen (TYLENOL) 500 MG tablet Take 500 mg by mouth every 6 (six) hours as needed for mild pain.    Marland Kitchen guaiFENesin (MUCINEX) 600 MG 12 hr tablet Take 1 tablet (600 mg total) by mouth 2 (two) times daily as needed for cough.    Marland Kitchen oxyCODONE (OXY IR/ROXICODONE) 5 MG immediate release tablet Take 1-2 tablets (5-10 mg total) by mouth every 4 (four) hours as needed for severe pain. 30 tablet 0  . amoxicillin-clavulanate (AUGMENTIN) 875-125 MG tablet Take 1 tablet by mouth every 12 (twelve) hours. (Patient not taking: Reported on 04/30/2015)  27 tablet 0   No current facility-administered medications for this visit.    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 Provider with another course of oral oxycodone for incisional pain She knows that she can do daily activities but not heavy lifting and can now  take a bath  If she wishes  Plan: Return for followup exam and chest x-ray for monitoring progress in 4 weeks  Mikey Bussing, MD [facility] [phone]

## 2015-05-09 LAB — FUNGUS CULTURE W SMEAR
Fungal Smear: NONE SEEN
Fungal Smear: NONE SEEN

## 2015-05-23 LAB — AFB CULTURE WITH SMEAR (NOT AT ARMC): Acid Fast Smear: NONE SEEN

## 2015-05-25 LAB — AFB CULTURE WITH SMEAR (NOT AT ARMC): Acid Fast Smear: NONE SEEN

## 2015-05-27 ENCOUNTER — Other Ambulatory Visit: Payer: Self-pay | Admitting: Cardiothoracic Surgery

## 2015-05-27 DIAGNOSIS — J869 Pyothorax without fistula: Secondary | ICD-10-CM

## 2015-05-28 ENCOUNTER — Ambulatory Visit
Admission: RE | Admit: 2015-05-28 | Discharge: 2015-05-28 | Disposition: A | Discharge status: Home / Self Care | Payer: BLUE CROSS/BLUE SHIELD | Source: Ambulatory Visit | Attending: Cardiothoracic Surgery | Admitting: Cardiothoracic Surgery

## 2015-05-28 ENCOUNTER — Encounter: Payer: Self-pay | Admitting: Cardiothoracic Surgery

## 2015-05-28 ENCOUNTER — Ambulatory Visit (INDEPENDENT_AMBULATORY_CARE_PROVIDER_SITE_OTHER): Payer: Self-pay | Admitting: Cardiothoracic Surgery

## 2015-05-28 VITALS — BP 122/82 | HR 75 | Resp 20 | Ht <= 58 in | Wt 120.0 lb

## 2015-05-28 DIAGNOSIS — J869 Pyothorax without fistula: Secondary | ICD-10-CM

## 2015-05-28 NOTE — Progress Notes (Signed)
PCP is No PCP Per Patient Referring Provider is Garlon HatchetSanders, Lisa M, PA-C  Chief Complaint  Patient presents with  . Routine Post Op    f/u from surgery with CXR, s/p  RT VATS,drainage of empyema and decortication of right lower lobe,04/10/15    ZOX:WRUEAHPI:final postop office check after right VATS for drainage of empyema and decortication right lower lobe almost 2 months ago. She  Finished  her course of antibiotics. She is afebrile without shortness of breath cough. Surgical incision is well-healed. She has postthoracotomy pain with neuritic-type pain under the right breast line. She takes 1 oxycodone daily. 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 Her chest x-ray today is clear and she is within normal exam. I gave her a return to work note without restrictions for 06/16/2015.   No past medical history on file.  Past Surgical History  Procedure Laterality Date  . Video assisted thoracoscopy (vats)/empyema Right 04/10/2015    Procedure: RIGHT VIDEO ASSISTED THORACOSCOPY (VATS)/EMPYEMA;  Surgeon: Kerin PernaPeter Van Trigt, MD;  [facility];  Service: Thoracic;  Laterality: Right;    No family history on file.  Social History Social History  Substance Use Topics  . Smoking status: Never Smoker   . Smokeless tobacco: None  . Alcohol Use: No    Current Outpatient Prescriptions  Medication Sig Dispense Refill  . acetaminophen (TYLENOL) 500 MG tablet Take 500 mg by mouth every 6 (six) hours as needed for mild pain.    Marland Kitchen. amoxicillin-clavulanate (AUGMENTIN) 875-125 MG tablet Take 1 tablet by mouth every 12 (twelve) hours. 27 tablet 0  . oxyCODONE (OXY IR/ROXICODONE) 5 MG immediate release tablet Take 1-2 tablets (5-10 mg total) by mouth every 4 (four) hours as needed for severe pain. 30 tablet 0   No current facility-administered medications for this visit.    No Known Allergies  Review of Systems   Improved activity strength and  appetite  BP 122/82 mmHg  Pulse 75  Resp 20  Ht 4\' 10"  (1.473 m)  Wt 120 lb (54.432 kg)  BMI 25.09 kg/m2  SpO2 99%  LMP 04/06/2015 Physical Exam Alert and comfortable Lungs clear bilaterally Heart rhythm regular VATS incision well-healed Neuro intact No peripheral edema  Diagnostic Tests: Chest x-ray clear  Impression: Good recovery after right VATS for empyema. She can resume normal activities She can return to work without restriction on 06/16/2015  Plan: Return as needed  Mikey BussingPeter Van Trigt III, MD [facility] [phone]

## 2015-06-23 ENCOUNTER — Emergency Department (HOSPITAL_COMMUNITY): Payer: BLUE CROSS/BLUE SHIELD

## 2015-06-23 ENCOUNTER — Emergency Department (HOSPITAL_COMMUNITY)
Admission: EM | Admit: 2015-06-23 | Discharge: 2015-06-23 | Disposition: A | Discharge status: Home / Self Care | Payer: BLUE CROSS/BLUE SHIELD | Attending: Emergency Medicine | Admitting: Emergency Medicine

## 2015-06-23 ENCOUNTER — Encounter (HOSPITAL_COMMUNITY): Payer: Self-pay

## 2015-06-23 DIAGNOSIS — R0602 Shortness of breath: Secondary | ICD-10-CM | POA: Diagnosis present

## 2015-06-23 DIAGNOSIS — R0789 Other chest pain: Secondary | ICD-10-CM | POA: Diagnosis not present

## 2015-06-23 DIAGNOSIS — Z79899 Other long term (current) drug therapy: Secondary | ICD-10-CM | POA: Diagnosis not present

## 2015-06-23 LAB — COMPREHENSIVE METABOLIC PANEL
ALBUMIN: 4.3 g/dL (ref 3.5–5.0)
ALK PHOS: 77 U/L (ref 38–126)
ALT: 28 U/L (ref 14–54)
ANION GAP: 10 (ref 5–15)
AST: 22 U/L (ref 15–41)
BILIRUBIN TOTAL: 0.8 mg/dL (ref 0.3–1.2)
BUN: 11 mg/dL (ref 6–20)
CALCIUM: 9.9 mg/dL (ref 8.9–10.3)
CO2: 26 mmol/L (ref 22–32)
CREATININE: 0.61 mg/dL (ref 0.44–1.00)
Chloride: 107 mmol/L (ref 101–111)
GFR calc Af Amer: >60 mL/min (ref >60)
GFR calc non Af Amer: >60 mL/min (ref >60)
GLUCOSE: 106 mg/dL — AB (ref 65–99)
Potassium: 3.7 mmol/L (ref 3.5–5.1)
SODIUM: 143 mmol/L (ref 135–145)
TOTAL PROTEIN: 7.5 g/dL (ref 6.5–8.1)

## 2015-06-23 LAB — I-STAT TROPONIN, ED
TROPONIN I, POC: 0 ng/mL (ref 0.00–0.08)
Troponin i, poc: 0 ng/mL (ref 0.00–0.08)

## 2015-06-23 LAB — CBC
HEMATOCRIT: 40.9 % (ref 36.0–46.0)
HEMOGLOBIN: 13.4 g/dL (ref 12.0–15.0)
MCH: 28.4 pg (ref 26.0–34.0)
MCHC: 32.8 g/dL (ref 30.0–36.0)
MCV: 86.7 fL (ref 78.0–100.0)
Platelets: 236 10*3/uL (ref 150–400)
RBC: 4.72 MIL/uL (ref 3.87–5.11)
RDW: 14.5 % (ref 11.5–15.5)
WBC: 5.2 10*3/uL (ref 4.0–10.5)

## 2015-06-23 LAB — D-DIMER, QUANTITATIVE: D-Dimer, Quant: <0.27 ug/mL-FEU (ref 0.00–0.50)

## 2015-06-23 MED ORDER — IBUPROFEN 200 MG PO TABS
400.0000 mg | ORAL_TABLET | Freq: Once | ORAL | Status: AC
  Administered 2015-06-23: 400 mg via ORAL
  Filled 2015-06-23: qty 2

## 2015-06-23 MED ORDER — IOPAMIDOL (ISOVUE-370) INJECTION 76%
INTRAVENOUS | Status: AC
  Administered 2015-06-23: 80 mL
  Filled 2015-06-23: qty 100

## 2015-06-23 NOTE — Discharge Instructions (Signed)
T?t c? cc th? nghi?m c?a b?n ngy hm nay l bnh th??ng, ti tin r?ng n?i ?au c?a b?n l th? y?u ??n ?au c? trong b?c t??ng ng?c h?i h?t. B?n c th? dng ibuprofen nh? m t? d??i ?y ?? gip b?n c?m th?y khng tho?i mi.  ?? ki?m sot c?n ?au, hy dng ibuprofen (cn g?i l Motrin ho?c Advil)  (th??ng l 4 vin thu?c ??i khng) 3 l?n trong ngy trong 5 ngy. Mang theo th?c ?n ?? gi?m thi?u kch ?ng d? dy.  ??ng ng?n ng?i tr? l?i phng c?p c?u v b?t k? tri?u ch?ng m?i, x?u ?i ho?c c lin quan.  Xin hy ch?m [place] chnh b?ng cch s? d?ng ti nguyn h??ng d?n d??i ?y. Cho h? bi?t r?ng b?n ? ???c nhn th?y trong phng c?p c?u v h? s? c?n ph?i c h? s? ?? qu?n l ngo?i tr thm.   All of your testing today is normal, I believe your pain is secondary to a muscle ache in the superficial chest wall. You can take ibuprofen as described below to help with your discomfort.  For pain control please take ibuprofen (also known as Motrin or Advil)  (this is normally 4 over the counter pills) 3 times a day  for 5 days. Take with food to minimize stomach irritation.  Do not hesitate to return to the emergency room for any new, worsening or concerning symptoms.  Please obtain primary care using resource guide below. Let them know that you were seen in the emergency room and that they will need to obtain records for further outpatient management.   [employer] 211 is a great source of information about community services available.  Access by dialing 2-1-1 from anywhere in [place], or by website -  PooledIncome.pl.   Other Local Resources (Updated 03/2015)  Financial Assistance   Services    Phone Number and Address  [facility]  Low-cost medical care - 1st and 3rd Saturday of every month  Must not qualify for public or private insurance and must have limited income [phone] [address] [place], [place]     [place] [employer] of Social Services  Child care  Emergency assistance for housing and [employer]  Medicaid [phone] [address] [place], [place] [address]   [facility] Department  Low-cost medical care for children, communicable diseases, sexually-transmitted diseases, immunizations, maternity care, womens health and family planning [phone] [address] [place], [place] [address]  [facility]   Medication assistance for [facility] residents  Must meet income requirements [phone] [address] [place], [place].    [facility] Social Services  Child care  Emergency assistance for housing and [employer]  Medicaid [id number] [address] [place], [place] [address]  [facility]   Low-cost medical care,   Monday through Friday, 9 am to 6 pm.   Accepts Medicare/Medicaid, and self-pay [phone] [address] [place], [place] [address]  [facility]  Low-cost medical care - Monday through Friday, 8:30 am - 5:30 pm  Accepts Medicaid and self-pay [phone] [address] [place], [place] [address]   [facility]  Primary medical care, including for those with sickle cell disease  Accepts Medicare, Medicaid, insurance and self-pay [phone] [address] [place], [place]  [facility]   Primary  medical care  Accepts Medicare, Medicaid, insurance and self-pay [phone] [address] [place], [place] [address]   [facility] of Social Services  Child care  Emergency assistance for housing and [employer]arkutilities  Food stamps  Medicaid [phone] [address] [place], [place] [address]  [facility] of Health and [employer] Services  Child care  Emergency assistance for  housing and [employer]ly[employer]arkutilities  Food stamps  Medicaid [phone] [address] [place], [place] [address]   [facility] Medication [facility]  Medication assistance for [facility] residents with no insurance only  Must have a primary care doctor [phone] [address] [place], [place]  [facility]   Primary medical care  [place] Medicare, [place], insurance  [phone] [address] [place], [place]  [facility]   Medication assistance [phone]  [facility]   Primary medical care  Accepts Medicare, [place], insurance and self-pay [phone] [address] [place], [place] [address]  [facility]   Primary medical care  Accepts Medicare, [place], insurance and self-pay [phone] [address] [place], [place] [address]  [facility]  For [place] residents between the ages of 6318 and 7464 who do not have any form of health insurance, Medicare, [place]icaid, or [place] benefits.  Services are provided free of charge to uninsured patients who fall within federal poverty guidelines.    Hours: Tuesdays and Thursdays, 4:15 - 8 pm [phone] [address], [address] [place], [place] [address]  [facility]     Primary medical care  Dental care  Nutritional counseling  Pharmacy  Accepts Medicaid, Medicare, most insurance.  Fees are adjusted based on ability to pay.   [phone] [facility] [address] [place], [place]  [phone] [facility] [address] [place], [place]  [phone] [facility] [place], [place]  [phone] [facility], [address] [place], [place]  [phone] [facility] [address] [place], [place]  [facility]  Womens health and family planning [phone] [place], [place]  [facility] Department of Social Services  Child care  Emergency assistance for housing and Kimberly-[employer]arkutilities  Food stamps  Medicaid [phone] [address], [place], [place] [address]   [facility]    Ages 1418 and older  Hours: Mondays and Thursdays, 7:00 am - 9:00 am Patients are seen on a first come, first served basis. [phone] [address] [place], [place]  [facility] of Social Services  Child care  Emergency assistance for housing and Kimberly-[employer]arkutilities  Food stamps  Medicaid [phone] [address] [place], [place] [address]  [employer]  Medication assistance  Rental assistance  Food pantry  Medication assistance  Housing assistance  Emergency food distribution  Utility assistance [phone] [address] [place], [place]  [phone]  [address] [place], [place] [address] Hours: Tuesdays and Thursdays from 9am - 12 noon by appointment only  [phone] [address] [place], [place] [address]  [facility] - Lanae Boastlara F. Gunn   Accepts private insurance, [place]icare, and [place]icaid.  Payment is based on a sliding scale for those without insurance.  Hours: Mondays, Tuesdays and Thursdays, 8:30 am - 5:30 pm.   [phone] [address] [place], [place]  [facility] - Family Medicine at [facility]  Accepts private insurance, Medicare, and Medicaid.  Payment is based on a sliding scale for those without insurance. [phone] [address] [place], [place]  [facility] - Pediatrics at E. Scientist, research (physical sciences), [employer], and [place].  Payment is based on a sliding scale for those without insurance [phone] [address], [employer], [place]  [facility] - Pediatrics at [employer], [place], and [place].  Payment is based on  a sliding scale for those without insurance. [phone] [address] [place], [place]  [facility] - Pediatrics at [place], [place], and [place].  Payment is based on a sliding scale for those without insurance. [phone], [phone]. [phone] [place], [place].    [facility]  Maternity care.  Accepts Medicaid and self-pay. [phone] [address] [place], [place]

## 2015-06-23 NOTE — ED Provider Notes (Signed)
CSN: [id number]     Arrival date & time 06/23/15  1320 History   First MD Initiated Contact with Patient 06/23/15 1656     Chief Complaint  Patient presents with  . Shortness of Breath  . Back Pain     (Consider location/radiation/quality/duration/timing/severity/associated sxs/prior Treatment) HPI  Blood pressure 141/87, pulse 80, temperature 98.4 F (36.9 C), temperature source Oral, resp. rate 18, weight 54.432 kg, last menstrual period 04/06/2015, SpO2 100 %.  Sara Li is a 50 y.o. female with past medical history significant for right-sided empyema status post VATS in mid February complaining of 2 weeks  (at that this contradicts triage note, double checked it with a shunt via translator)of central chest pain radiating to the back with associated pleuritic component shortness of breath. Patient denies fever, chills, cough, palpitation, increasing peripheral edema, history of DVT/PE, recent immobilization, calf pain, leg swelling. Had normal postop follow-up with cardiothoracic surgery. 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 Patient states that she was taking oxycodone for the postoperative pain but states that this pain is different that she hasn't been taking her oxycodone.  History reviewed. No pertinent past medical history. Past Surgical History  Procedure Laterality Date  . Video assisted thoracoscopy (vats)/empyema Right 04/10/2015    Procedure: RIGHT VIDEO ASSISTED THORACOSCOPY (VATS)/EMPYEMA;  Surgeon: Sara Perna, MD;  Location: [facility] OR;  Service: Thoracic;  Laterality: Right;   No family history on file. Social History  Substance Use Topics  . Smoking status: Never Smoker   . Smokeless tobacco: None  . Alcohol Use: No   OB History    No data available     Review of  Systems  10 systems reviewed and found to be negative, except as noted in the HPI.   Allergies  Review of patient's allergies indicates no known allergies.  Home Medications   Prior to Admission medications   Medication Sig Start Date End Date Taking? Authorizing Provider  acetaminophen (TYLENOL) 500 MG tablet Take 500 mg by mouth every 6 (six) hours as needed for mild pain.    Historical Provider, MD  amoxicillin-clavulanate (AUGMENTIN) 875-125 MG tablet Take 1 tablet by mouth every 12 (twelve) hours. 04/16/15   Sara Margaretann Loveless, PA-C  oxyCODONE (OXY IR/ROXICODONE) 5 MG immediate release tablet Take 1-2 tablets (5-10 mg total) by mouth every 4 (four) hours as needed for severe pain. 04/16/15   Sara Margaretann Loveless, PA-C   BP 141/87 mmHg  Pulse 80  Temp(Src) 98.4 F (36.9 C) (Oral)  Resp 18  Wt 54.432 kg  SpO2 100%  LMP 04/06/2015 Physical Exam  Constitutional: She is oriented to person, place, and time. She appears well-developed and well-nourished. No distress.  HENT:  Head: Normocephalic.  Mouth/Throat: Oropharynx is clear and moist.  Eyes: Conjunctivae are normal.  Neck: Normal range of motion. No JVD present. No tracheal deviation present.  Cardiovascular: Normal rate, regular rhythm and intact distal pulses.   Radial pulse equal bilaterally  Pulmonary/Chest: Effort normal and breath sounds normal. No stridor. No respiratory distress. She has no wheezes. She has no rales. She exhibits no tenderness.  Abdominal: Soft. She exhibits no distension and no mass. There is no tenderness. There is no rebound and no guarding.  Musculoskeletal: Normal range of  motion. She exhibits no edema or tenderness.  No calf asymmetry, superficial collaterals, palpable cords, edema, Homans sign negative bilaterally.    Neurological: She is alert and oriented to person, place, and time.  Skin: Skin is warm. She is not diaphoretic.  Psychiatric: She has a normal mood and affect.  Nursing note  and vitals reviewed.   ED Course  Procedures (including critical care time) Labs Review Labs Reviewed  COMPREHENSIVE METABOLIC PANEL - Abnormal; Notable for the following:    Glucose, Bld 106 (*)    All other components within normal limits  CBC  D-DIMER, QUANTITATIVE (NOT AT [facility])  Sara SensorI-STAT TROPOININ, ED    Imaging Review Dg Chest 2 View  06/23/2015  CLINICAL DATA:  Post VATS 2/17, shortness of breath, back pain EXAM: CHEST  2 VIEW COMPARISON:  05/28/2015 and 04/30/2015 FINDINGS: Cardiomediastinal silhouette is stable. No acute infiltrate or pleural effusion. No pulmonary edema. Bony thorax is unremarkable. Stable right basilar pleural thickening and scarring. IMPRESSION: No active cardiopulmonary disease. Electronically Signed   By: Sara MeadLiviu  Pop M.D.   On: 06/23/2015 14:13   I have personally reviewed and evaluated these images and lab results as part of my medical decision-making.   EKG Interpretation None      MDM   Final diagnoses:  Chest wall pain    Filed Vitals:   06/23/15 2015 06/23/15 2115 06/23/15 2145 06/23/15 2330  BP: 115/87 124/87 118/87 111/89  Pulse: 74 78 76 73  Temp: 98.7 F (37.1 C)     TempSrc: Oral     Resp: 16     Weight:      SpO2: 100% 98% 98% 98%    Medications  ibuprofen (ADVIL,MOTRIN) tablet 400 mg (400 mg Oral Given 06/23/15 1732)  iopamidol (ISOVUE-370) 76 % injection (80 mLs  Contrast Given 06/23/15 2214)    Sara Li is 50 y.o. female presenting with central chest pain with central thoracic back pain intermittently over the course of several weeks. Of note, patient had a VATS for right-sided empyema several months ago. Patient afebrile, clear lung sounds, vital signs without abnormality. Chest x-ray is clear, EKG nonischemic, troponin and blood work unremarkable. 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  Patient low  risk by heart score, delta troponin negative. CT a negative. Encouraged patient to take high-dose NSAIDs, resource guide given.  Evaluation does not show pathology that would require ongoing emergent intervention or inpatient treatment. Pt is hemodynamically stable and mentating appropriately. Discussed findings and plan with patient/guardian, who agrees with care plan. All questions answered. Return precautions discussed and outpatient follow up given.      Wynetta Emeryicole Jerimey Burridge, PA-C 06/23/15 2352  Glynn OctaveStephen Rancour, MD 06/24/15 [phone]

## 2015-06-23 NOTE — ED Notes (Signed)
Patient here with ongoing shortness of breath and back pain for 2 months. Reports thru interpretor that she had fluid in her lungs and had surgery for same. Speaking compete sentences on arrival and in no distress

## 2015-06-25 ENCOUNTER — Ambulatory Visit (INDEPENDENT_AMBULATORY_CARE_PROVIDER_SITE_OTHER): Payer: Self-pay | Admitting: Cardiothoracic Surgery

## 2015-06-25 ENCOUNTER — Encounter: Payer: Self-pay | Admitting: Cardiothoracic Surgery

## 2015-06-25 VITALS — BP 108/79 | HR 82 | Resp 16 | Ht <= 58 in

## 2015-06-25 DIAGNOSIS — J869 Pyothorax without fistula: Secondary | ICD-10-CM

## 2015-06-25 DIAGNOSIS — Z09 Encounter for follow-up examination after completed treatment for conditions other than malignant neoplasm: Secondary | ICD-10-CM

## 2015-06-25 NOTE — Progress Notes (Signed)
PCP is No PCP Per Patient Referring Provider is Jonnie KindSaunders, Weston W, MD  Chief Complaint  Patient presents with  . Routine Post Op    discuss continued post op pain, unable to work some days or complete shifts..went to ED 4/24 and had CTA CHEST    HPI:the patient returns with multiple complaints: Same onset right VATS for empyema. After her last visit she was cleared to return to work however she has had significant problems with fatigue shortness of breath and also some postthoracotomy pain.  The patient was evaluated in the emergency department last week where CTA was performed which rule out PE. It showed no evidence of recurrent empyema or pneumonia, no evidence of pleural effusion, no evidence of pneumothorax or rib fracture. Her hemoglobin was normal and she was released.   I tried to  to sort out her complaints  with a translator in the room provided by the hospital - she indicates that she has had some substernal discomfort described as "fatigue"  on the surface over her midsternum.she states she gives out after getting up in the morning to showering ,get dressed. 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 No history of smoking   No past medical history on file.  Past Surgical History  Procedure Laterality Date  . Video assisted thoracoscopy (vats)/empyema Right 04/10/2015    Procedure: RIGHT VIDEO ASSISTED THORACOSCOPY (VATS)/EMPYEMA;  Surgeon: Kerin PernaPeter Van Trigt, MD;  [facility];  Service: Thoracic;  Laterality: Right;    No family history on file.  Social History Social History  Substance Use Topics  . Smoking status: Never Smoker   . Smokeless tobacco: None  . Alcohol Use: No    Current Outpatient Prescriptions  Medication Sig Dispense Refill  . gabapentin (NEURONTIN) 300 MG capsule Take 300 mg by mouth at bedtime.    Marland Kitchen. oxyCODONE (OXY IR/ROXICODONE) 5 MG immediate release tablet Take 1-2 tablets (5-10 mg  total) by mouth every 4 (four) hours as needed for severe pain. 30 tablet 0  . acetaminophen (TYLENOL) 500 MG tablet Take 500 mg by mouth every 6 (six) hours as needed for mild pain.     No current facility-administered medications for this visit.    No Known Allergies  Review of Systems   She still has some postthoracotomy pain which she describes as as between 2 and 4/10 in intensity  BP 108/79 mmHg  Pulse 82  Resp 16  Ht 4\' 10"  (1.473 m)  SpO2 98%  LMP 04/06/2015 Physical Exam Alert and comfortable Right VATS incision well-healed Breath sounds clear and equal Conjunctiva pink No JVD or adenopathy the neck Heart rhythm regular murmur or gallop  Diagnostic Tests: CT scan clear, no P. Chest x-ray clear Impression: Substernal discomfort described as fatigue with exercise. Decreased exercise tolerance now almost 3 months after VATS.  Plan:Will obtain a stress echo to assess possible cardiac disease. Patient will return after stress echo. I believe the patient should be medically excused from her job for the next 2 months until the symptoms are fully evaluated.   Mikey BussingPeter Van Trigt III, MD [facility] [phone]

## 2015-06-26 ENCOUNTER — Other Ambulatory Visit: Payer: Self-pay | Admitting: *Deleted

## 2015-06-26 DIAGNOSIS — R0789 Other chest pain: Secondary | ICD-10-CM

## 2015-06-27 ENCOUNTER — Ambulatory Visit (INDEPENDENT_AMBULATORY_CARE_PROVIDER_SITE_OTHER): Payer: BLUE CROSS/BLUE SHIELD | Admitting: Family Medicine

## 2015-06-27 ENCOUNTER — Encounter: Payer: Self-pay | Admitting: Family Medicine

## 2015-06-27 VITALS — BP 100/70 | HR 85 | Temp 98.4°F | Resp 16 | Ht <= 58 in | Wt 110.4 lb

## 2015-06-27 DIAGNOSIS — J869 Pyothorax without fistula: Secondary | ICD-10-CM

## 2015-06-27 DIAGNOSIS — K529 Noninfective gastroenteritis and colitis, unspecified: Secondary | ICD-10-CM

## 2015-06-27 LAB — COMPREHENSIVE METABOLIC PANEL
ALBUMIN: 4.4 g/dL (ref 3.6–5.1)
ALK PHOS: 72 U/L (ref 33–130)
ALT: 13 U/L (ref 6–29)
AST: 14 U/L (ref 10–35)
BILIRUBIN TOTAL: 0.5 mg/dL (ref 0.2–1.2)
BUN: 11 mg/dL (ref 7–25)
CALCIUM: 9.6 mg/dL (ref 8.6–10.4)
CO2: 26 mmol/L (ref 20–31)
Chloride: 103 mmol/L (ref 98–110)
Creat: 0.55 mg/dL (ref 0.50–1.05)
Glucose, Bld: 97 mg/dL (ref 65–99)
Potassium: 3.9 mmol/L (ref 3.5–5.3)
Sodium: 142 mmol/L (ref 135–146)
TOTAL PROTEIN: 7.3 g/dL (ref 6.1–8.1)

## 2015-06-27 LAB — POCT URINALYSIS DIP (MANUAL ENTRY)
Blood, UA: NEGATIVE
Glucose, UA: NEGATIVE
LEUKOCYTES UA: NEGATIVE
Nitrite, UA: NEGATIVE
PH UA: 7.5
SPEC GRAV UA: 1.02
Urobilinogen, UA: 1

## 2015-06-27 LAB — POCT CBC
Granulocyte percent: 61.4 %G (ref 37–80)
HEMATOCRIT: 39 % (ref 37.7–47.9)
HEMOGLOBIN: 13.6 g/dL (ref 12.2–16.2)
Lymph, poc: 1.7 (ref 0.6–3.4)
MCH: 29.4 pg (ref 27–31.2)
MCHC: 34.9 g/dL (ref 31.8–35.4)
MCV: 84.3 fL (ref 80–97)
MID (cbc): 0.3 (ref 0–0.9)
MPV: 6.2 fL (ref 0–99.8)
PLATELET COUNT, POC: 284 10*3/uL (ref 142–424)
POC GRANULOCYTE: 3.3 (ref 2–6.9)
POC LYMPH PERCENT: 32.6 %L (ref 10–50)
POC MID %: 6 %M (ref 0–12)
RBC: 4.62 M/uL (ref 4.04–5.48)
RDW, POC: 15 %
WBC: 5.3 10*3/uL (ref 4.6–10.2)

## 2015-06-27 LAB — POC MICROSCOPIC URINALYSIS (UMFC)

## 2015-06-27 MED ORDER — ONDANSETRON 4 MG PO TBDP: 4.0000 mg | ORAL_TABLET | Freq: Three times a day (TID) | ORAL | Status: DC | PRN

## 2015-06-27 NOTE — Progress Notes (Signed)
Subjective:    Patient ID: Sara Li, female    DOB: 08/21/1965, 50 y.o.   MRN: [id number]  06/27/2015  Nausea; Diarrhea; and Chills   HPI This 50 y.o. female presents for evaluation of nausea, diarrhea, chills.  Onset three days ago.  No fever but +chills/sweats.  No abdominal pain.  +n/v x 3 each day.  +diarrhea x  3 each day. Vomit is non-bloody and non-bilious.  Diarrhea non-bloody and non-mucous.  No recent travel.  +recent antibiotics for empyema; s/p recent VATS procedure by Dr. Morton PetersVan Tright; had follow-up this week and doing well.  Also underwent ED evaluation last week for chest pain; s/p CT chest that was negative for pulmonary embolism or recurrent empyema.  No sick contacts; concerned about food poisoning.  Drinking throughout the day but is decreased from baseline.   Review of Systems  Constitutional: Positive for chills and diaphoresis. Negative for fever and fatigue.  Eyes: Negative for visual disturbance.  Respiratory: Negative for cough and shortness of breath.   Cardiovascular: Negative for chest pain, palpitations and leg swelling.  Gastrointestinal: Positive for nausea, vomiting and diarrhea. Negative for abdominal pain and constipation.  Endocrine: Negative for cold intolerance, heat intolerance, polydipsia, polyphagia and polyuria.  Neurological: Negative for dizziness, tremors, seizures, syncope, facial asymmetry, speech difficulty, weakness, light-headedness, numbness and headaches.    History reviewed. No pertinent past medical history. Past Surgical History  Procedure Laterality Date  . Video assisted thoracoscopy (vats)/empyema Right 04/10/2015    Procedure: RIGHT VIDEO ASSISTED THORACOSCOPY (VATS)/EMPYEMA;  Surgeon: Kerin PernaPeter Van Trigt, MD;  [facility];  Service: Thoracic;  Laterality: Right;   No Known Allergies  Social History   Social History  . Marital Status: Married    Spouse Name: N/A  . Number of Children: N/A  . Years of Education: N/A    Occupational History  . Not on file.   Social History Main Topics  . Smoking status: Never Smoker   . Smokeless tobacco: Not on file  . Alcohol Use: No  . Drug Use: No  . Sexual Activity: Not on file   Other Topics Concern  . Not on file   Social History Narrative   History reviewed. No pertinent family history.     Objective:    BP 100/70 mmHg  Pulse 85  Temp(Src) 98.4 F (36.9 C) (Oral)  Resp 16  Ht 4\' 9"  (1.448 m)  Wt 110 lb 6 oz (50.066 kg)  BMI 23.88 kg/m2  SpO2 95%  LMP 04/06/2015 Physical Exam  Constitutional: She is oriented to person, place, and time. She appears well-developed and well-nourished. No distress.  HENT:  Head: Normocephalic and atraumatic.  Right Ear: External ear normal.  Left Ear: External ear normal.  Nose: Nose normal.  Mouth/Throat: Oropharynx is clear and moist.  Eyes: Conjunctivae and EOM are normal. Pupils are equal, round, and reactive to light.  Neck: Normal range of motion. Neck supple. Carotid bruit is not present. No thyromegaly present.  Cardiovascular: Normal rate, regular rhythm, normal heart sounds and intact distal pulses.  Exam reveals no gallop and no friction rub.   No murmur heard. Pulmonary/Chest: Effort normal and breath sounds normal. She has no wheezes. She has no rales.  Abdominal: Soft. Bowel sounds are normal. She exhibits no distension and no mass. There is no tenderness. There is no rebound and no guarding.  Lymphadenopathy:    She has no cervical adenopathy.  Neurological: She is alert and oriented to person, place,  and time. No cranial nerve deficit.  Skin: Skin is warm and dry. No rash noted. She is not diaphoretic. No erythema. No pallor.  Psychiatric: She has a normal mood and affect. Her behavior is normal.        Assessment & Plan:   1. Gastroenteritis   2. Empyema of right pleural space (HCC)    -New. -obtain stool studies due to recent abx for empyema. -benign abdominal exam. -BRAT diet,  hydration. -rx for Zofran provided.   Orders Placed This Encounter  Procedures  . Clostridium Difficile by PCR    Order Specific Question:  Is your patient experiencing loose or watery stools (3 or more in 24 hours)?    Answer:  Yes    Order Specific Question:  Has the patient received laxatives in the last 24 hours?    Answer:  No    Order Specific Question:  Has a negative Cdiff test resulted in the last 7 days?    Answer:  No  . Stool culture  . Ova and parasite examination  . Comprehensive metabolic panel  . Orthostatic vital signs  . POCT CBC  . POCT urinalysis dipstick  . POCT Microscopic Urinalysis (UMFC)   Meds ordered this encounter  Medications  . ondansetron (ZOFRAN-ODT) 4 MG disintegrating tablet    Sig: Take 1-2 tablets (4-8 mg total) by mouth every 8 (eight) hours as needed for nausea.    Dispense:  20 tablet    Refill:  0    No Follow-up on file.    Pleasant Britz Paulita Fujita, M.D. [facility] [address] [place], [place]  [address] [phone] phone [phone] fax

## 2015-06-27 NOTE — Patient Instructions (Addendum)
IF you received an x-ray today, you will receive an invoice from [facility]. Please contact [facility] at [phone] with questions or concerns regarding your invoice.   IF you received labwork today, you will receive an invoice from [employer]. Please contact [facility] at [phone] with questions or concerns regarding your invoice.   Our billing staff will not be able to assist you with questions regarding bills from these companies.  You will be contacted with the lab results as soon as they are available. The fastest way to get your results is to activate your My Chart account. Instructions are located on the last page of this paperwork. If you have not heard from [place] regarding the results in 2 weeks, please contact this office.     Vim ???ng Tiu Ha Do Vi Rt (Viral Gastroenteritis) Vim ???ng tiu ha do vi rt cn ???c g?i l cm d? dy. Tnh tr?ng ny ?nh h??ng ??n d? dy v ???ng ru?t. N c th? gy tiu ch?y v nn m?a ??t ng?t. B?nh th??ng ko di t? 3 ??n 8 ngy. H?u h?t m?i ng??i c ?p ?ng mi?n d?ch m cu?i cng s? kh?i nhi?m vi rt. Trong khi c ?p ?ng t? nhin ny, vi rt c th? lm cho b?n r?t m?t. NGUYN NHN Nhi?u vi rt khc nhau c th? gy ra vim ???ng tiu ha, ch?ng h?n nh? rotavirus ho?c norovirus. B?n c th? b? nhi?m m?t trong nh?ng lo?i vi rt ny do tiu th? th?c ph?m ho?c n??c b? nhi?m b?n. B?n c?ng c th? b? nhi?m vi rt do dng chung d?ng c? ho?c v?t d?ng c nhn khc v?i ng??i b? b?nh ho?c do ch?m vo b? m?t b? nhi?m b?n. TRI?U CH?NG Cc tri?u ch?ng ph? bi?n nh?t l tiu ch?y v nn m?a. Nh?ng v?n ?? ny c th? khi?n cho c? th? m?t d?ch (n??c) nghim tr?ng v c? th? m?t cn b?ng mu?i (?i?n gi?i). Cc tri?u ch?ng khc c th? bao g?m:  S?t.  ?au ??u.  M?t m?i.  ?au b?ng. CH?N ?ON Chuyn gia ch?m [place] s?c kh?e th??ng c th? ch?n ?on vim ???ng tiu ha do vi rt d?a vo cc tri?u ch?ng v khm th?c  th?. M?u phn c?ng c th? ???c l?y ?? xt nghi?m xem c s? hi?n di?n c?a vi rt ho?c nhi?m trng khc khng. ?I?U TR? B?nh ny th??ng t? kh?i. ?i?u tr? nh?m m?c ?ch b n??c. Cc tr??ng h?p nghim tr?ng nh?t c?a vim ???ng tiu ha do vi rt lin quan ??n nn m?a tr?m tr?ng khi?n b?n khng th? gi? ???c n??c trong c? th?. Trong nh?ng tr??ng h?p ny, ch?t l?ng ph?i ???c ??a vo c? th? thng qua m?t ???ng truy?n t?nh m?ch (IV). H??NG D?N CH?M [place] T?I NH  U?ng ?? n??c ?? gi? cho n??c ti?u trong ho?c vng nh?t. U?ng m?t l??ng nh? ch?t l?ng th??ng xuyn v t?ng ln theo kh? n?ng dung n?p.  H?i chuyn gia ch?m [place] s?c kh?e ?? ???c h??ng d?n b n??c c? th?.  Trnh:  Th?c ph?m c hm l??ng ???ng cao.  R??u.  ?? u?ng c ga.  Thu?c l.  N??c p tri cy.  ?? u?ng c caffeine.  Ch?t l?ng c?c nng ho?c l?nh.  Th?c ph?m bo, c nhi?u d?u.  ?n ho?c u?ng b?t c? th? g qu nhi?u m?t lc.  S?n ph?m t? s?a cho ??n 24 - 48 gi? sau khi d?ng tiu ch?y.  B?n c th? tiu th? cc ch? ph?m sinh h?c. Probiotics l s? nui c?y vi khu?n c l?i ch? ??ng. Chng c th? lm gi?m b?t l??ng phn v s? l?n tiu ch?y ? ng??i l?n. Probiotics c th? ???c tm th?y trong s?a chua v?i nh?ng nui c?y ch? ??ng v cc ch?t b? sung.  R?a tay k? ?? trnh ly lan vi rt.  Ch? s? d?ng thu?c khng c?n k toa ho?c thu?c c?n k toa ?? gi?m ?au, gi?m c?m gic kh ch?u ho?c h? s?t theo ch? d?n c?a chuyn gia ch?m [place] s?c kh?e c?a b?n. Khng cho tr? em s? d?ng aspirin. Khng nn dng thu?c ch?ng tiu ch?y.  Hy h?i chuyn gia ch?m [place] s?c kh?e c?a b?n xem c nn ti?p t?c u?ng thu?c ???c k toa thng th??ng ho?c thu?c khng c?n k toa c?a b?n khng.  Tun th? m?i cu?c h?n khm l?i theo ch? d?n c?a chuyn gia ch?m [place] s?c kh?e. HY NGAY L?P T?C ?I KHM N?U:  B?n khng th? gi? ch?t l?ng trong ng??i.  B?n khng ?i ti?u t nh?t m?t l?n m?i 6 ??n 8 ti?ng.  B?n b? kh th?.  B?n th?y c mu trong phn ho?c ch?t nn. Phn ho?c  ch?t nn c th? trng nh? b c ph.  B?n b? ?au b?ng gia t?ng ho?c ?au t?p trung ? m?t vng nh? (c?c b?).  B?n b? nn m?a ho?c tiu ch?y dai d?ng.  B?n b? s?t.  B?nh nhn l tr? d??i 3 thng tu?i v b b? s?t.  B?nh nhn l tr? trn 3 thng tu?i v b b? s?t v c cc tri?u ch?ng ko di.  B?nh nhn l tr? trn 3 thng tu?i v b b? s?t v c cc tri?u ch?ng ??t nhin tr? nn t?i t? h?n.  B?nh nhn l m?t em b v b khng c n??c m?t khi khc. ??M B?O B?N:  Hi?u cc h??ng d?n ny.  S? theo di tnh tr?ng c?a mnh.  S? yu c?u tr? gip ngay l?p t?c n?u b?n c?m th?y khng ?? ho?c tnh tr?ng tr?m tr?ng h?n.   Thng tin ny khng nh?m m?c ?ch thay th? cho l?i khuyn m chuyn gia ch?m [place] s?c kh?e ni v?i qu v?. Hy b?o ??m qu v? ph?i th?o lu?n b?t k? v?n ?? g m qu v? c v?i chuyn gia ch?m [place] s?c kh?e c?a qu v?.   Document Released: 05/10/2011 Document Revised: 10/18/2012 Elsevier Interactive Patient Education [employer]2016 Elsevier Inc.

## 2015-07-01 ENCOUNTER — Encounter: Payer: Self-pay | Admitting: *Deleted

## 2015-07-01 LAB — OVA AND PARASITE EXAMINATION: OP: NONE SEEN

## 2015-07-01 LAB — CLOSTRIDIUM DIFFICILE BY PCR: Toxigenic C. Difficile by PCR: NOT DETECTED

## 2015-07-03 ENCOUNTER — Encounter: Payer: Self-pay | Admitting: Physician Assistant

## 2015-07-03 ENCOUNTER — Ambulatory Visit (HOSPITAL_COMMUNITY)
Admission: RE | Admit: 2015-07-03 | Discharge: 2015-07-03 | Disposition: A | Discharge status: Home / Self Care | Payer: BLUE CROSS/BLUE SHIELD | Source: Ambulatory Visit | Attending: Cardiothoracic Surgery | Admitting: Cardiothoracic Surgery

## 2015-07-03 ENCOUNTER — Telehealth: Payer: Self-pay | Admitting: Physician Assistant

## 2015-07-03 DIAGNOSIS — R079 Chest pain, unspecified: Secondary | ICD-10-CM | POA: Diagnosis present

## 2015-07-03 DIAGNOSIS — R0789 Other chest pain: Secondary | ICD-10-CM | POA: Insufficient documentation

## 2015-07-03 LAB — ECHOCARDIOGRAM STRESS TEST
Estimated workload: 9.7 METS
Exercise duration (min): 7 min
Exercise duration (sec): 48 s
MPHR: 170 {beats}/min
Peak HR: 150 {beats}/min
Percent HR: 88 %
RPE: 19
Rest HR: 78 {beats}/min

## 2015-07-03 NOTE — Progress Notes (Signed)
*  PRELIMINARY RESULTS* Echocardiogram Echocardiogram Stress Test has been performed.  Sara Li, Sara Li 07/03/2015, 12:08 PM

## 2015-07-03 NOTE — Telephone Encounter (Signed)
Patient gave verbal consent to inform result of stress test to her daughter [place]. Informed patient's daughter her stress echo is normal.  Signed, Azalee CourseHao Phillp Dolores PA Pager: [phone]

## 2015-07-04 LAB — STOOL CULTURE

## 2015-07-09 ENCOUNTER — Ambulatory Visit (INDEPENDENT_AMBULATORY_CARE_PROVIDER_SITE_OTHER): Payer: BLUE CROSS/BLUE SHIELD | Admitting: Cardiothoracic Surgery

## 2015-07-09 ENCOUNTER — Encounter: Payer: Self-pay | Admitting: Cardiothoracic Surgery

## 2015-07-09 VITALS — BP 117/83 | HR 76 | Resp 20 | Ht <= 58 in | Wt 110.0 lb

## 2015-07-09 DIAGNOSIS — J869 Pyothorax without fistula: Secondary | ICD-10-CM

## 2015-07-09 NOTE — Progress Notes (Signed)
PCP is No PCP Per Patient Referring Provider is Garlon HatchetSanders, Lisa M, PA-C  Chief Complaint  Patient presents with  . Routine Post Op    3 week f/u to Discuss stress ECHO    HPI: Slow recovery after right VATS for loculated empyema Since the last visit the patient underwent a stress echo because of complaints of precordial discomfort with activity Stress echo showed normal EF, no evidence of ischemia or valvular disease She still has multiple complaints with lack of appetite and injury, feeling cold, low vitamin D levels. Her chest x-rays clear, or chest incision is completely healed and she is on room air with normal saturation. Her thoracic surgical issues have resolved and she was recommended to seek a primary care physician for her other medical complaints. A primary provider was recommended to the patient and her family.  Past Medical History  Diagnosis Date  . Empyema, right [facility](HCC)     Past Surgical History  Procedure Laterality Date  . Video assisted thoracoscopy (vats)/empyema Right 04/10/2015    Procedure: RIGHT VIDEO ASSISTED THORACOSCOPY (VATS)/EMPYEMA;  Surgeon: Kerin PernaPeter Van Trigt, MD;  [facility];  Service: Thoracic;  Laterality: Right;    No family history on file.  Social History Social History  Substance Use Topics  . Smoking status: Never Smoker   . Smokeless tobacco: None  . Alcohol Use: No    Current Outpatient Prescriptions  Medication Sig Dispense Refill  . acetaminophen (TYLENOL) 500 MG tablet Take 500 mg by mouth every 6 (six) hours as needed for mild pain. Reported on 06/27/2015    . gabapentin (NEURONTIN) 300 MG capsule Take 300 mg by mouth at bedtime. Reported on 06/27/2015    . oxyCODONE (OXY IR/ROXICODONE) 5 MG immediate release tablet Take 1-2 tablets (5-10 mg total) by mouth every 4 (four) hours as needed for severe pain. 30 tablet 0  . ondansetron (ZOFRAN-ODT) 4 MG disintegrating tablet Take 1-2 tablets (4-8 mg total) by mouth every 8 (eight) hours as  needed for nausea. (Patient not taking: Reported on 07/09/2015) 20 tablet 0   No current facility-administered medications for this visit.    No Known Allergies  Review of Systems  Feels cold, no appetite, feels weak  BP 117/83 mmHg  Pulse 76  Resp 20  Ht 4\' 9"  (1.448 m)  Wt 110 lb (49.896 kg)  BMI 23.80 kg/m2  SpO2 99%  LMP 04/06/2015 Physical Exam Appears depressed and anxious Breath sounds clear bilaterally Right VATS incision well-healed Heart rhythm regular  Diagnostic Tests: Echo cardiogram results reviewed and counseled patient and her family  Impression: Patient is recovered from her right VATS She may return to work in mid June 2017  Plan: Return as needed  Mikey BussingPeter Van Trigt III, MD [facility] [phone]

## 2015-07-28 ENCOUNTER — Emergency Department (HOSPITAL_COMMUNITY)
Admission: EM | Admit: 2015-07-28 | Discharge: 2015-07-28 | Disposition: A | Discharge status: Home / Self Care | Payer: BLUE CROSS/BLUE SHIELD | Attending: Emergency Medicine | Admitting: Emergency Medicine

## 2015-07-28 ENCOUNTER — Emergency Department (HOSPITAL_COMMUNITY): Payer: BLUE CROSS/BLUE SHIELD

## 2015-07-28 ENCOUNTER — Encounter (HOSPITAL_COMMUNITY): Payer: Self-pay

## 2015-07-28 DIAGNOSIS — Z79899 Other long term (current) drug therapy: Secondary | ICD-10-CM | POA: Insufficient documentation

## 2015-07-28 DIAGNOSIS — R5383 Other fatigue: Secondary | ICD-10-CM | POA: Diagnosis not present

## 2015-07-28 DIAGNOSIS — R112 Nausea with vomiting, unspecified: Secondary | ICD-10-CM | POA: Insufficient documentation

## 2015-07-28 DIAGNOSIS — R630 Anorexia: Secondary | ICD-10-CM | POA: Insufficient documentation

## 2015-07-28 DIAGNOSIS — R109 Unspecified abdominal pain: Secondary | ICD-10-CM

## 2015-07-28 DIAGNOSIS — R1030 Lower abdominal pain, unspecified: Secondary | ICD-10-CM | POA: Insufficient documentation

## 2015-07-28 DIAGNOSIS — Z3202 Encounter for pregnancy test, result negative: Secondary | ICD-10-CM | POA: Insufficient documentation

## 2015-07-28 LAB — URINALYSIS, ROUTINE W REFLEX MICROSCOPIC
BILIRUBIN URINE: NEGATIVE
Glucose, UA: NEGATIVE mg/dL
HGB URINE DIPSTICK: NEGATIVE
Ketones, ur: NEGATIVE mg/dL
Leukocytes, UA: NEGATIVE
Nitrite: NEGATIVE
PH: 7.5 (ref 5.0–8.0)
Protein, ur: NEGATIVE mg/dL
SPECIFIC GRAVITY, URINE: 1.006 (ref 1.005–1.030)

## 2015-07-28 LAB — CBC
HEMATOCRIT: 43.7 % (ref 36.0–46.0)
HEMOGLOBIN: 14 g/dL (ref 12.0–15.0)
MCH: 28.9 pg (ref 26.0–34.0)
MCHC: 32 g/dL (ref 30.0–36.0)
MCV: 90.3 fL (ref 78.0–100.0)
Platelets: 257 10*3/uL (ref 150–400)
RBC: 4.84 MIL/uL (ref 3.87–5.11)
RDW: 13.1 % (ref 11.5–15.5)
WBC: 6.4 10*3/uL (ref 4.0–10.5)

## 2015-07-28 LAB — COMPREHENSIVE METABOLIC PANEL
ALBUMIN: 4.1 g/dL (ref 3.5–5.0)
ALK PHOS: 62 U/L (ref 38–126)
ALT: 19 U/L (ref 14–54)
ANION GAP: 9 (ref 5–15)
AST: 19 U/L (ref 15–41)
BUN: 10 mg/dL (ref 6–20)
CALCIUM: 9.7 mg/dL (ref 8.9–10.3)
CHLORIDE: 108 mmol/L (ref 101–111)
CO2: 24 mmol/L (ref 22–32)
Creatinine, Ser: 0.57 mg/dL (ref 0.44–1.00)
GFR calc non Af Amer: >60 mL/min (ref >60)
GLUCOSE: 102 mg/dL — AB (ref 65–99)
POTASSIUM: 3.9 mmol/L (ref 3.5–5.1)
SODIUM: 141 mmol/L (ref 135–145)
Total Bilirubin: 0.8 mg/dL (ref 0.3–1.2)
Total Protein: 7.1 g/dL (ref 6.5–8.1)

## 2015-07-28 LAB — I-STAT CG4 LACTIC ACID, ED
Lactic Acid, Venous: 1.04 mmol/L (ref 0.5–2.0)
Lactic Acid, Venous: 0.96 mmol/L (ref 0.5–2.0)

## 2015-07-28 LAB — I-STAT BETA HCG BLOOD, ED (MC, WL, AP ONLY)

## 2015-07-28 LAB — LIPASE, BLOOD: LIPASE: 25 U/L (ref 11–51)

## 2015-07-28 MED ORDER — IOPAMIDOL (ISOVUE-300) INJECTION 61%
INTRAVENOUS | Status: AC
  Filled 2015-07-28: qty 100

## 2015-07-28 MED ORDER — ONDANSETRON HCL 4 MG PO TABS: 4.0000 mg | ORAL_TABLET | Freq: Four times a day (QID) | ORAL | Status: DC

## 2015-07-28 MED ORDER — GI COCKTAIL ~~LOC~~
30.0000 mL | Freq: Once | ORAL | Status: AC
  Administered 2015-07-28: 30 mL via ORAL
  Filled 2015-07-28: qty 30

## 2015-07-28 MED ORDER — DIATRIZOATE MEGLUMINE & SODIUM 66-10 % PO SOLN
ORAL | Status: AC
  Filled 2015-07-28: qty 30

## 2015-07-28 MED ORDER — OMEPRAZOLE 20 MG PO CPDR: 20.0000 mg | DELAYED_RELEASE_CAPSULE | Freq: Every day | ORAL | Status: DC

## 2015-07-28 MED ORDER — ONDANSETRON HCL 4 MG/2ML IJ SOLN
4.0000 mg | Freq: Once | INTRAMUSCULAR | Status: AC
  Administered 2015-07-28: 4 mg via INTRAVENOUS
  Filled 2015-07-28: qty 2

## 2015-07-28 MED ORDER — SUCRALFATE 1 GM/10ML PO SUSP: 1.0000 g | Freq: Three times a day (TID) | ORAL | Status: DC

## 2015-07-28 NOTE — ED Provider Notes (Signed)
CSN: [id number]     Arrival date & time 07/28/15  1057 History   First MD Initiated Contact with Patient 07/28/15 1159     Chief Complaint  Patient presents with  . Abdominal Pain     (Consider location/radiation/quality/duration/timing/severity/associated sxs/prior Treatment) HPI Comments: 50 year old female with history of empyema presents for vomiting and abdominal pain. The patient reports that she has been having these symptoms for over 1 week. She reports that every time she tries to eat food she vomits it up. She has been able to drink fluids and has been trying to keep herself well-hydrated. She reports that at times she has burning pain in her abdomen although at times she also feels like she has pain in her lower abdomen. She denies any chest pain or shortness of breath. No fevers or chills. No diarrhea. She denies constipation. She was seen at an outside facility and diagnosed with gastroenteritis and prescribed Zofran. She said this has not been helping. History was taken using a [place] interpreter.   Past Medical History  Diagnosis Date  . Empyema, right [facility])    Past Surgical History  Procedure Laterality Date  . Video assisted thoracoscopy (vats)/empyema Right 04/10/2015    Procedure: RIGHT VIDEO ASSISTED THORACOSCOPY (VATS)/EMPYEMA;  Surgeon: Kerin Perna, MD;  Location: [facility] OR;  Service: Thoracic;  Laterality: Right;   No family history on file. Social History  Substance Use Topics  . Smoking status: Never Smoker   . Smokeless tobacco: None  . Alcohol Use: No   OB History    No data available     Review of Systems  Constitutional: Positive for appetite change and fatigue. Negative for fever and chills.  HENT: Negative for congestion, postnasal drip, rhinorrhea and sinus pressure.   Eyes: Negative for visual disturbance.  Respiratory: Negative for cough, chest tightness and shortness of breath.   Cardiovascular: Negative for chest pain and palpitations.   Gastrointestinal: Positive for nausea, vomiting and abdominal pain. Negative for diarrhea.  Genitourinary: Negative for dysuria, urgency and hematuria.  Musculoskeletal: Negative for myalgias and back pain.  Skin: Negative for rash.  Neurological: Negative for dizziness, weakness, light-headedness and headaches.  Hematological: Does not bruise/bleed easily.      Allergies  Review of patient's allergies indicates no known allergies.  Home Medications   Prior to Admission medications   Medication Sig Start Date End Date Taking? Authorizing Provider  acetaminophen (TYLENOL) 500 MG tablet Take 500 mg by mouth every 6 (six) hours as needed for mild pain. Reported on 06/27/2015   Yes Historical Provider, MD  Multiple Vitamins-Minerals (MULTIVITAMIN WITH MINERALS) tablet Take 1 tablet by mouth daily.   Yes Historical Provider, MD  oxyCODONE (OXY IR/ROXICODONE) 5 MG immediate release tablet Take 1-2 tablets (5-10 mg total) by mouth every 4 (four) hours as needed for severe pain. 04/16/15  Yes Donielle Margaretann Loveless, PA-C  gabapentin (NEURONTIN) 300 MG capsule Take 300 mg by mouth at bedtime. Reported on 06/27/2015    Historical Provider, MD  omeprazole (PRILOSEC) 20 MG capsule Take 1 capsule (20 mg total) by mouth daily. 07/28/15   Leta Baptist, MD  ondansetron (ZOFRAN) 4 MG tablet Take 1 tablet (4 mg total) by mouth every 6 (six) hours. 07/28/15   Leta Baptist, MD  ondansetron (ZOFRAN-ODT) 4 MG disintegrating tablet Take 1-2 tablets (4-8 mg total) by mouth every 8 (eight) hours as needed for nausea. Patient not taking: Reported on 07/09/2015 06/27/15   Ethelda Chick, MD  sucralfate (CARAFATE) 1 GM/10ML suspension Take 10 mLs (1 g total) by mouth 4 (four) times daily -  with meals and at bedtime. 07/28/15   Leta BaptistEmily Roe Rosa, MD   BP 129/82 mmHg  Pulse 72  Temp(Src) 98.8 F (37.1 C) (Oral)  Resp 18  SpO2 96%  LMP 04/06/2015 Physical Exam  Constitutional: She is oriented to person, place,  and time. She appears well-developed and well-nourished. No distress.  HENT:  Head: Normocephalic and atraumatic.  Right Ear: External ear normal.  Left Ear: External ear normal.  Nose: Nose normal.  Mouth/Throat: Oropharynx is clear and moist. No oropharyngeal exudate.  Eyes: EOM are normal. Pupils are equal, round, and reactive to light.  Neck: Normal range of motion. Neck supple.  Cardiovascular: Normal rate, regular rhythm, normal heart sounds and intact distal pulses.   No murmur heard. Pulmonary/Chest: Effort normal. No respiratory distress. She has no wheezes. She has no rales.  Abdominal: Soft. She exhibits no distension. There is tenderness (mild lower abdominal).  Musculoskeletal: Normal range of motion. She exhibits no edema or tenderness.  Neurological: She is alert and oriented to person, place, and time.  Skin: Skin is warm and dry. No rash noted. She is not diaphoretic.  Vitals reviewed.   ED Course  Procedures (including critical care time) Labs Review Labs Reviewed  COMPREHENSIVE METABOLIC PANEL - Abnormal; Notable for the following:    Glucose, Bld 102 (*)    All other components within normal limits  LIPASE, BLOOD  CBC  URINALYSIS, ROUTINE W REFLEX MICROSCOPIC (NOT AT [facility])  I-STAT CG4 LACTIC ACID, ED  I-STAT BETA HCG BLOOD, ED ([facility], [facility], [facility] ONLY)  I-STAT CG4 LACTIC ACID, ED    Imaging Review Dg Chest 2 View  07/28/2015  CLINICAL DATA:  50 year old female with history of weakness, cough and vomiting. EXAM: CHEST  2 VIEW COMPARISON:  Chest x-ray 06/23/2015. FINDINGS: Lung volumes are normal. No consolidative airspace disease. No pleural effusions. No pneumothorax. No pulmonary nodule or mass noted. Suture line again noted in the lateral aspect of the lower right lung, presumably from prior wedge resection. Pulmonary vasculature and the cardiomediastinal silhouette are within normal limits. IMPRESSION: No radiographic evidence of acute cardiopulmonary disease.  Electronically Signed   By: Trudie Reedaniel  Entrikin M.D.   On: 07/28/2015 13:04   Ct Abdomen Pelvis W Contrast  07/28/2015  CLINICAL DATA:  Lower abdominal pain and pressure for 1 week. Vomiting for 2-3 weeks. History of "Right-sided empyema" . EXAM: CT ABDOMEN AND PELVIS WITH CONTRAST TECHNIQUE: Multidetector CT imaging of the abdomen and pelvis was performed using the standard protocol following bolus administration of intravenous contrast. CONTRAST:  100 cc of Isovue-300 COMPARISON:  Chest CT 06/23/2015. FINDINGS: Lower chest: Dependent right upper lobe subsegmental atelectasis. Normal heart size without pericardial or pleural effusion. Hepatobiliary: Prominent caudate and lateral segment left liver lobes with subtle atrophy of the medial segment left lobe suspected. Subcentimeter hepatic dome lesion is likely a cyst. Normal gallbladder, without biliary ductal dilatation. Pancreas: Normal, without mass or ductal dilatation. Periampullary duodenal diverticulum incidentally noted. Spleen: Tiny low-density subcapsular splenic focus is of doubtful clinical significance. Adrenals/Urinary Tract: Normal adrenal glands. An upper pole low-density left renal lesion is likely a cyst. Normal right kidney, without hydronephrosis or hydroureter. Normal urinary bladder. Stomach/Bowel: Gastric antral underdistention. Normal colon and terminal ileum. Appendix is not visualized but there is no evidence of right lower quadrant inflammation. Normal small bowel. Vascular/Lymphatic: Normal caliber of the aorta and branch vessels. No  abdominopelvic adenopathy. Reproductive: Normal uterus and adnexa. Other: No significant free fluid. Musculoskeletal: No acute osseous abnormality. Disc bulge at L4-5 incidentally noted. IMPRESSION: 1.  No acute process in the abdomen or pelvis. 2. Subtle hepatic morphology for which cirrhosis cannot be excluded. Correlate with risk factors. Electronically Signed   By: Jeronimo Greaves M.D.   On: 07/28/2015 16:15    I have personally reviewed and evaluated these images and lab results as part of my medical decision-making.   EKG Interpretation   Date/Time:  Monday Jul 28 2015 16:41:51 EDT Ventricular Rate:  70 PR Interval:  129 QRS Duration: 87 QT Interval:  378 QTC Calculation: 408 R Axis:   77 Text Interpretation:  Sinus rhythm No significant change since last  tracing Confirmed by Knoop, Patience Nuzzo ([id number]) on 07/28/2015 4:57:31 PM      MDM  Patient was seen and evaluated in stable condition.  Benign examination.  Normal labs and CT without acute process.  Patient tolerated PO without difficulty.  Possible gastritis or other stomach irritation.  Will give the patient prescription for omeprazole and zofran.  Patient instructed to follow up with primary care and GI outpatient.  All results and plan of care were discussed at length with patient's son on the phone per her request.   Final diagnoses:  Abdominal pain, unspecified abdominal location  Nausea and vomiting, vomiting of unspecified type    1. Abdominal pain  2. Vomiting    Leta Baptist, MD 07/28/15 [phone]

## 2015-07-28 NOTE — Discharge Instructions (Signed)
You were seen and evaluated today for your abdominal pain and vomiting.  The exact cause is unclear but this could be secondary to gastritis or increased acid in your stomach.  Follow up with your primary care physician and the stomach doctor as soon as possible.  Take the medications prescribed.  Eat 5-6 small meals a day of bland food.  Drink lots of fluids including clear sodas and gatorade.    ?au b?ng, Ng??i l?n (Abdominal Pain, Adult) C nhi?u nguyn nhn d?n ??n ?au b?ng. Thng th??ng ?au b?ng l do m?t b?nh gy ra v s? khng ?? n?u khng ?i?u tr?Marland Kitchen B?nh ny c th? ???c theo di v ?i?u tr? t?i nh. Chuyn gia ch?m [place] s?c kh?e s? ti?n hnh khm th?c th? v c th? yu c?u lm xt nghi?m mu v ch?p X quang ?? xc ??nh m?c ?? nghim tr?ng c?a c?n ?au b?ng. Tuy nhin, trong nhi?u tr??ng h?p, ph?i m?t nhi?u th?i gian h?n ?? xc ??nh r nguyn nhn gy ?au b?ng. Tr??c khi tm ra nguyn nhn, chuyn gia ch?m [place] s?c kh?e c th? khng bi?t li?u qu v? c c?n lm thm xt nghi?m ho?c ti?p t?c ?i?u tr? hay khng. H??NG D?N CH?M [place] T?I NH  Theo di c?n ?au b?ng xem c b?t k? thay ??i no khng. Nh?ng hnh ??ng sau c th? gip lo?i b? b?t c? c?m gic kh ch?u no qu v? ?ang b?.  Ch? s? d?ng thu?c khng c?n k ??n ho?c thu?c c?n k ??n theo ch? d?n c?a chuyn gia ch?m [place] s?c kh?e.  Khng dng thu?c nhu?n trng tr? khi ???c chuyn gia ch?m [place] s?c kh?e ch? ??nh.  Th? dng m?t ch? ?? ?n l?ng (n??c lu?c th?t, tr, ho?c n??c) theo ch? d?n c?a chuyn gia ch?m [place] s?c kh?e. Chuy?n d?n sang m?t ch? ?? ?n nh? n?u ch?u ???c. ?I KHM N?U:  Qu v? b? ?au b?ng khng r nguyn nhn.  Qu v? b? ?au b?ng km theo bu?n nn ho?c tiu ch?y.  Qu v? b? ?au khi ?i ti?u ho?c ?i ngoi.  Qu v? b? c?n ?au b?ng lm th?c gi?c vo ban ?m.  Qu v? b? ?au b?ng n?ng thm ho?c ?? h?n khi ?n.  Qu v? b? ?au b?ng n?ng thm khi ?n ?? ?n nhi?u ch?t bo.  Qu v? b? s?t. NGAY L?P T?C ?I KHM N?U:   C?n ?au khng kh?i  trong vng 2 gi?Ladell Heads v? v?n ti?p t?c b? i (nn m?a).  Ch? c th? c?m th?y ?au ? m?t s? ph?n b?ng, ch?ng h?n nh? ? ph?n b?ng bn ph?i ho?c bn d??i tri.  Phn c?a qu v? c mu ho?c c mu ?en nh? h?c n. ??M B?O QU V?:  Hi?u r cc h??ng d?n ny.  S? theo di tnh tr?ng c?a mnh.  S? yu c?u tr? gip ngay l?p t?c n?u qu v? c?m th?y khng kh?e ho?c th?y tr?m tr?ng h?n.   Thng tin ny khng nh?m m?c ?ch thay th? cho l?i khuyn m chuyn gia ch?m [place] s?c kh?e ni v?i qu v?. Hy b?o ??m qu v? ph?i th?o lu?n b?t k? v?n ?? g m qu v? c v?i chuyn gia ch?m [place] s?c kh?e c?a qu v?.   Document Released: 02/15/2005 Document Revised: 03/08/2014 Elsevier Interactive Patient Education 2016 Elsevier Inc.  Vim D? Dy, Ng??i L?n (Gastritis, Adult) Vim d? dy l hi?n t??ng ?au nh?c v s?ng (vim) ?  l?p nim m?c d? dy. Vim d? dy c th? pht tri?n nh? l m?t tnh tr?ng kh?i pht ??t ng?t (c?p tnh) ho?c ko di (m?n tnh). N?u vim d? dy khng ???c ?i?u tr?, b?nh c th? d?n ??n ch?y mu v lot d? dy.  NGUYN NHN Vim d? dy xu?t hi?n khi l?p nim m?c d? dy y?u ho?c b? t?n th??ng. Sau ?, d?ch tiu ha t? d? dy gy vim nim m?c d? dy b? suy y?u. Nim m?c d? dy c th? y?u ho?c b? t?n th??ng do nhi?m vi rt ho?c vi khu?n. M?t nhi?m trng do vi khu?n ph? bi?n l nhi?m trng do Helicobacter pylori. Vim d? dy c?ng c th? do u?ng r??u qu nhi?u, dng m?t s? lo?i thu?c nh?t ??nh ho?c c qu nhi?u axit trong d? dy. TRI?U CH?NG [place] m?t s? tr??ng h?p, khng c tri?u ch?ng. Khi c tri?u ch?ng, chng c th? bao g?m:  ?au ho?c c?m gic nng rt ? vng b?ng trn.  Bu?n nn.  Nn.  C?m gic kh ch?u do no sau khi ?n. CH?N ?ON Chuyn gia ch?m [place] s?c kh?e c th? nghi ng? b?n b? vim d? dy d?a vo cc tri?u ch?ng c?a b?n v khm th?c th?. ?? xc ??nh nguyn nhn vim d? dy, chuyn gia ch?m [place] s?c kh?e c th? th?c hi?n nh? sau:  Xt nghi?m mu ho?c phn ?? ki?m tra vi khu?n H  pylori.  Soi d? dy. M?t ?ng m?ng, m?m (?n n?i soi) ???c lu?n xu?ng th?c qu?n v vo d? dy. ?n n?i soi c g?n ?n v camera ? ??u. Chuyn gia ch?m [place] s?c kh?e s? d?ng ?n n?i soi ?? xem bn trong d? dy.  L?y m?u m (sinh thi?t) t? d? dy ?? ki?m tra d??i knh hi?n vi. ?I?U TR? Ty thu?c vo nguyn nhn gy vim d? dy, thu?c c th? ???c k ??n. N?u b?n b? nhi?m trng do vi khu?n, ch?ng h?n nh? nhi?m trng do H pylori, khng sinh c th? ???c cho dng. N?u vim d? dy l do c qu nhi?u axit trong d? dy, thu?c ch?n H2 ho?c thu?c trung ha axit c th? ???c cho dng. Chuyn gia ch?m [place] s?c kh?e c th? khuy?n ngh? b?n nn ng?ng dng atpirin, ibuprofen ho?c thu?c khng vim khng c steroid khc (NSAID). H??NG D?N CH?M [place] T?I NH  Ch? s? d?ng thu?c khng c?n k toa ho?c thu?c c?n k toa theo ch? d?n c?a chuyn gia ch?m [place] s?c kh?e.  N?u b?n ???c cho dng thu?c khng sinh, hy dng theo ch? d?n. Dng h?t thu?c ngay c? khi b?n b?t ??u c?m th?y ?? h?n.  U?ng ?? n??c ?? gi? cho n??c ti?u trong ho?c vng nh?t.  Young Berryrnh cc lo?i th?c ph?m v ?? u?ng lm cho cc tri?u ch?ng t?i t? h?n, ch?ng h?n nh?:  Caffeine ho?c ?? u?ng c c?n.  S c la.  B?c h ho?c v? b?c h.  T?i v hnh ty.  Th?c ?n [place].  Tri cy h? cam, ch?ng h?n nh? cam, chanh hay chanh ty.  Cc th?c ?n c c chua, ch?ng h?n nh? n??c x?t, ?t, salsa (n??c x?t [place]) v bnh pizza.  Cc lo?i th?c ?n chin xo v nhi?u ch?t bo.  ?n nh?ng b?a ?n nh?, th??ng xuyn h?n thay v cc b?a ?n l?n. HY NGAY L?P T?C ?I KHM N?U:  Phn c mu ?en ho?c ?? s?m.  B?n nn ra mu ho?c  ch?t gi?ng nh? b?t c ph.  B?n khng th? gi? ch?t l?ng trong ng??i.  B?n b? ?au b?ng tr?m tr?ng h?n.  B?n b? s?t.  B?n khng c?m th?y kh?e h?n sau 1 tu?n.  B?n c b?t c? cu h?i ho?c th?c m?c no khc. ??M B?O B?N:  Hi?u cc h??ng d?n ny.  S? theo di tnh tr?ng c?a mnh.  S? yu c?u tr? gip ngay l?p t?c n?u b?n c?m th?y khng ?? ho?c tnh  tr?ng tr?m tr?ng h?n.   Thng tin ny khng nh?m m?c ?ch thay th? cho l?i khuyn m chuyn gia ch?m [place] s?c kh?e ni v?i qu v?. Hy b?o ??m qu v? ph?i th?o lu?n b?t k? v?n ?? g m qu v? c v?i chuyn gia ch?m [place] s?c kh?e c?a qu v?.   Document Released: 02/15/2005 Document Revised: 10/18/2012 Elsevier Interactive Patient Education [employer]

## 2015-07-28 NOTE — ED Notes (Signed)
Per t, Pt started to have abdominal pain and emesis a couple days ago. Pt is able to keep some fluids down, but has not been able to eat anything without vomiting. Pt denies diarrhea, but reports HX of diarrhea three weeks ago with relief after medication. Pt is vietnamese and this RN utilized Music therapisttranslator phone during triage.

## 2015-07-29 MED ORDER — IOPAMIDOL (ISOVUE-300) INJECTION 61%
100.0000 mL | Freq: Once | INTRAVENOUS | Status: AC | PRN
  Administered 2015-07-28: 100 mL via INTRAVENOUS

## 2015-08-07 ENCOUNTER — Ambulatory Visit (INDEPENDENT_AMBULATORY_CARE_PROVIDER_SITE_OTHER): Payer: BLUE CROSS/BLUE SHIELD

## 2015-08-07 ENCOUNTER — Ambulatory Visit (INDEPENDENT_AMBULATORY_CARE_PROVIDER_SITE_OTHER): Payer: BLUE CROSS/BLUE SHIELD | Admitting: Family Medicine

## 2015-08-07 VITALS — BP 108/68 | HR 82 | Temp 98.0°F | Resp 17 | Ht <= 58 in | Wt 104.0 lb

## 2015-08-07 DIAGNOSIS — M545 Low back pain, unspecified: Secondary | ICD-10-CM

## 2015-08-07 DIAGNOSIS — M791 Myalgia: Secondary | ICD-10-CM

## 2015-08-07 DIAGNOSIS — M609 Myositis, unspecified: Secondary | ICD-10-CM

## 2015-08-07 DIAGNOSIS — R1013 Epigastric pain: Secondary | ICD-10-CM | POA: Diagnosis not present

## 2015-08-07 DIAGNOSIS — R112 Nausea with vomiting, unspecified: Secondary | ICD-10-CM

## 2015-08-07 DIAGNOSIS — M6289 Other specified disorders of muscle: Secondary | ICD-10-CM

## 2015-08-07 DIAGNOSIS — IMO0001 Reserved for inherently not codable concepts without codable children: Secondary | ICD-10-CM

## 2015-08-07 DIAGNOSIS — G729 Myopathy, unspecified: Secondary | ICD-10-CM

## 2015-08-07 LAB — POCT CBC
Granulocyte percent: 62 %G (ref 37–80)
HCT, POC: 41.8 % (ref 37.7–47.9)
HEMOGLOBIN: 14.6 g/dL (ref 12.2–16.2)
Lymph, poc: 1.8 (ref 0.6–3.4)
MCH: 30 pg (ref 27–31.2)
MCHC: 34.9 g/dL (ref 31.8–35.4)
MCV: 85.9 fL (ref 80–97)
MID (cbc): 0.4 (ref 0–0.9)
MPV: 6.1 fL (ref 0–99.8)
PLATELET COUNT, POC: 280 10*3/uL (ref 142–424)
POC GRANULOCYTE: 3.7 (ref 2–6.9)
POC LYMPH PERCENT: 31.3 %L (ref 10–50)
POC MID %: 6.7 % (ref 0–12)
RBC: 4.87 M/uL (ref 4.04–5.48)
RDW, POC: 13.3 %
WBC: 5.9 10*3/uL (ref 4.6–10.2)

## 2015-08-07 LAB — POCT SEDIMENTATION RATE: POCT SED RATE: 2 mm/hr (ref 0–22)

## 2015-08-07 MED ORDER — SUCRALFATE 1 GM/10ML PO SUSP: 1.0000 g | Freq: Three times a day (TID) | ORAL | Status: DC

## 2015-08-07 MED ORDER — TIZANIDINE HCL 2 MG PO CAPS: 2.0000 mg | ORAL_CAPSULE | Freq: Three times a day (TID) | ORAL | Status: DC | PRN

## 2015-08-07 MED ORDER — OMEPRAZOLE 20 MG PO CPDR: 20.0000 mg | DELAYED_RELEASE_CAPSULE | Freq: Every day | ORAL | Status: DC

## 2015-08-07 MED ORDER — ONDANSETRON 4 MG PO TBDP: 4.0000 mg | ORAL_TABLET | Freq: Three times a day (TID) | ORAL | Status: DC | PRN

## 2015-08-07 NOTE — Progress Notes (Signed)
Subjective:    Patient ID: Sara Li, female    DOB: Mar 08, 1965, 50 y.o.   MRN: [id number]  08/07/2015  Leg Pain; Back Pain; and Abdominal Pain   HPI This 50 y.o. female presents for evaluation of abdominal pain from sternum to lower abdomen.  Having vomiting after eating. Onset a few weeks ago.  Evaluated a few weeks ago due to vomiting and diarrhea.  Symptoms improved after last visit.  Evaluated on 06/27/15 for abdominal pain, diarrhea.  Stool studies negative at that visit.  Onset 3 weeks ago.  No fever but +chills; no sweats.  +nausea with vomiting x 3-4 times; today vomited 3-4 times.  Non-bloody vomit; non-bilious vomiting; only food contents.  Diarrhea yesterday but this morning normal bm today.  Pain in epigastric region mostly.      S/p CT abd/pelvis negative 07/28/15.  Heartburn in back; having pain in back.  S/p CBC, CMET, Lipase, u/a, urine pregnancy in ED on 07/28/15 all negative.  S/p CXR on 07/28/15; negative.  Mild cough.  No dysuria, urgency, frequency.  Prescribed Omeprazole , Zofran every 5 hours, Carafate prior to each meal.  Lower abdominal pain has improved since ED visit yet epigatric/sternum pain is not better.  Eating makes pain worse.  Also has pain without eating.  S/p EKG WNL.     Having B quadricep pain with squatting and standing B.  Having thoracic back pain but no lower back pain.  Onset four days ago.    Review of Systems  Constitutional: Negative for fever, chills, diaphoresis and fatigue.  Eyes: Negative for visual disturbance.  Respiratory: Negative for cough and shortness of breath.   Cardiovascular: Negative for chest pain, palpitations and leg swelling.  Gastrointestinal: Positive for nausea, vomiting and abdominal pain. Negative for diarrhea, constipation, blood in stool and anal bleeding.  Endocrine: Negative for cold intolerance, heat intolerance, polydipsia, polyphagia and polyuria.  Musculoskeletal: Positive for myalgias and back pain.    Neurological: Negative for dizziness, tremors, seizures, syncope, facial asymmetry, speech difficulty, weakness, light-headedness, numbness and headaches.    Past Medical History  Diagnosis Date  . Empyema, right [facility])    Past Surgical History  Procedure Laterality Date  . Video assisted thoracoscopy (vats)/empyema Right 04/10/2015    Procedure: RIGHT VIDEO ASSISTED THORACOSCOPY (VATS)/EMPYEMA;  Surgeon: Kerin Perna, MD;  Location: [facility] OR;  Service: Thoracic;  Laterality: Right;   No Known Allergies  Social History   Social History  . Marital Status: Married    Spouse Name: N/A  . Number of Children: N/A  . Years of Education: N/A   Occupational History  . Not on file.   Social History Main Topics  . Smoking status: Never Smoker   . Smokeless tobacco: Not on file  . Alcohol Use: No  . Drug Use: No  . Sexual Activity: Not on file   Other Topics Concern  . Not on file   Social History Narrative   No family history on file.     Objective:    BP 108/68 mmHg  Pulse 82  Temp(Src) 98 F (36.7 C) (Oral)  Resp 17  Ht  (1.473 m)  Wt 104 lb (47.174 kg)  BMI 21.74 kg/m2  SpO2 98%  LMP 04/06/2015 Physical Exam  Constitutional: She is oriented to person, place, and time. She appears well-developed and well-nourished. No distress.  HENT:  Head: Normocephalic and atraumatic.  Right Ear: External ear normal.  Left Ear: External ear normal.  Nose:  Nose normal.  Mouth/Throat: Oropharynx is clear and moist.  Eyes: Conjunctivae and EOM are normal. Pupils are equal, round, and reactive to light.  Neck: Normal range of motion. Neck supple. Carotid bruit is not present. No thyromegaly present.  Cardiovascular: Normal rate, regular rhythm, normal heart sounds and intact distal pulses.  Exam reveals no gallop and no friction rub.   No murmur heard. Pulmonary/Chest: Effort normal and breath sounds normal. She has no wheezes. She has no rales.  Abdominal: Soft. Bowel  sounds are normal. She exhibits no distension and no mass. There is no tenderness. There is no rebound and no guarding.  Musculoskeletal:       Right hip: Normal. She exhibits normal range of motion and normal strength.       Left hip: Normal. She exhibits normal range of motion, normal strength and no tenderness.       Thoracic back: She exhibits pain. She exhibits normal range of motion, no tenderness, no bony tenderness, no spasm and normal pulse.       Lumbar back: Normal. She exhibits normal range of motion, no tenderness, no pain, no spasm and normal pulse.       Right upper leg: Normal. She exhibits no tenderness and no bony tenderness.       Left upper leg: Normal. She exhibits no tenderness and no bony tenderness.  Lymphadenopathy:    She has no cervical adenopathy.  Neurological: She is alert and oriented to person, place, and time. No cranial nerve deficit.  Skin: Skin is warm and dry. No rash noted. She is not diaphoretic. No erythema. No pallor.  Psychiatric: She has a normal mood and affect. Her behavior is normal.        Assessment & Plan:   1. Abdominal pain, epigastric   2. Myalgia and myositis   3. Bilateral low back pain without sciatica   4. Quadricep tightness   5. Nausea and vomiting, intractability of vomiting not specified, unspecified vomiting type     Orders Placed This Encounter  Procedures  . DG Lumbar Spine Complete    Standing Status: Future     Number of Occurrences: 1     Standing Expiration Date: 08/06/2016    Order Specific Question:  Reason for Exam (SYMPTOM  OR DIAGNOSIS REQUIRED)    Answer:  lower back pain and B quadricep pain B    Order Specific Question:  Is the patient pregnant?    Answer:  No    Order Specific Question:  Preferred imaging location?    Answer:  External  . DG FEMUR MIN 2 VIEWS LEFT    Standing Status: Future     Number of Occurrences: 1     Standing Expiration Date: 10/06/2016    Order Specific Question:  Reason for Exam  (SYMPTOM  OR DIAGNOSIS REQUIRED)    Answer:  b quadricep pain with squatting    Order Specific Question:  Is the patient pregnant?    Answer:  No    Order Specific Question:  Preferred imaging location?    Answer:  External  . Comprehensive metabolic panel  . CK  . Ambulatory referral to Gastroenterology    Referral Priority:  Routine    Referral Type:  Consultation    Referral Reason:  Specialty Services Required    Number of Visits Requested:  1  . Orthostatic vital signs  . POCT CBC  . POCT SEDIMENTATION RATE   Meds ordered this encounter  Medications  .  DISCONTD: omeprazole (PRILOSEC) 20 MG capsule    Sig: Take 1 capsule (20 mg total) by mouth daily.    Dispense:  30 capsule    Refill:  5  . DISCONTD: sucralfate (CARAFATE) 1 GM/10ML suspension    Sig: Take 10 mLs (1 g total) by mouth 4 (four) times daily -  with meals and at bedtime.    Dispense:  420 mL    Refill:  0  . DISCONTD: ondansetron (ZOFRAN-ODT) 4 MG disintegrating tablet    Sig: Take 1-2 tablets (4-8 mg total) by mouth every 8 (eight) hours as needed for nausea.    Dispense:  20 tablet    Refill:  0  . DISCONTD: tizanidine (ZANAFLEX) 2 MG capsule    Sig: Take 1 capsule (2 mg total) by mouth 3 (three) times daily as needed for muscle spasms.    Dispense:  20 capsule    Refill:  0    No Follow-up on file.    Jury Caserta Paulita Fujita, M.D. [facility] [address] [place], [place]  [address] [phone] phone [phone] fax

## 2015-08-07 NOTE — Patient Instructions (Addendum)
IF you received an x-ray today, you will receive an invoice from [facility]. Please contact [facility] at [phone] with questions or concerns regarding your invoice.   IF you received labwork today, you will receive an invoice from [employer]. Please contact [facility] at [phone] with questions or concerns regarding your invoice.   Our billing staff will not be able to assist you with questions regarding bills from these companies.  You will be contacted with the lab results as soon as they are available. The fastest way to get your results is to activate your My Chart account. Instructions are located on the last page of this paperwork. If you have not heard from [place] regarding the results in 2 weeks, please contact this office.     We recommend that you schedule a mammogram for breast cancer screening. Typically, you do not need a referral to do this. Please contact a local imaging center to schedule your mammogram.  [facility] - [phone]  *ask for the Radiology Department [facility]) - [phone] or [phone]  [facility] - [phone] [facility] - [phone] [facility] - [phone]  *ask for the Radiology Department [facility] - [phone]  *ask for the Radiology Department [facility] - [phone]  *ask for the Mammography Department [facility] - [phone]  Quadriceps Contusion With Rehab A contusion is a bruise to the skin and underlying tissues. The muscles on the top of the thigh (quadriceps) are a common location for a contusion. The contusion is caused by trauma that results in bleeding that enters the muscles, tendons, and skin.  SYMPTOMS   Pain, inflammation, and tenderness over the quadriceps muscle.  Characteristic "black and blue" discoloration.  Feeling of firmness when  pressure is exerted at the injury site.  Limited function of the quadriceps muscles (straightening the knee or bending the hip).  Knee stiffness or pain when trying to bend the knee. CAUSES  Quadriceps contusions are caused by direct trauma to the thigh the ruptures small blood vessels, allowing blood to enter the soft tissues. RISK INCREASES WITH:  Contact sports (football, rugby, or soccer).  Failure to wear protective equipment.  Bleeding disorder or use of blood thinners (anticoagulants), or nonsteroidal anti-inflammatory medications. PREVENTION   Wear properly fitted and padded protective equipment.  If you have a bleeding disorder, then avoid any activities that may result in a traumatic injury.  Limit use of anticoagulants and nonsteroidal anti-inflammatory medications. PROGNOSIS  If treated properly, then quadriceps contusions usually heal within 2 weeks.  RELATED COMPLICATIONS   Complications from excessive bleeding, especially compartment syndrome.  Bone formation (calcification) that occurs during healing and may result in pain or limited function.  Infection (uncommon).  Knee stiffness or loss of motion.  Prolonged healing time, if improperly treated or re-injured. TREATMENT  Treatment initially involves the use of ice and medication to help reduce pain and inflammation. Do not take nonsteroidal anti-inflammatory medications within 3 days of injury. The use of strengthening and stretching exercises may help reduce pain with activity. These exercises may be performed at home or with referral to a therapist. Compression bandages may also help reduce pain and inflammation. Rarely, your caregiver may use a needle to remove the collection of blood under the skin (hematoma).  MEDICATION If pain medication is necessary, then nonsteroidal anti-inflammatory medications, such as aspirin and ibuprofen, or other minor  pain relievers, such as acetaminophen, are often recommended.   HEAT AND COLD  Cold treatment (icing) relieves pain and reduces inflammation. Cold treatment should be applied for 10 to 15 minutes every 2 to 3 hours for inflammation and pain and immediately after any activity that aggravates your symptoms. Use ice packs or massage the area with a piece of ice (ice massage).  Heat treatment may be used prior to performing the stretching and strengthening activities prescribed by your caregiver, physical therapist, or athletic trainer. Use a heat pack or soak the injury in warm water. SEEK MEDICAL CARE IF:  Treatment seems to offer no benefit, or the condition worsens.  Any medications produce adverse side effects. EXERCISES  RANGE OF MOTION (ROM) AND STRETCHING EXERCISES - Quadriceps Contusion These exercises may help you when beginning to rehabilitate your injury. Your symptoms may resolve with or without further involvement from your physician, physical therapist or athletic trainer. While completing these exercises, remember:   Restoring tissue flexibility helps normal motion to return to the joints. This allows healthier, less painful movement and activity.  An effective stretch should be held for at least 30 seconds.  A stretch should never be painful. You should only feel a gentle lengthening or release in the stretched tissue. RANGE OF MOTION - Knee Flexion, Active  Lie on your back with both knees straight. (If this causes back discomfort, bend your opposite knee, placing your foot flat on the floor.)  Slowly slide your heel back toward your buttocks until you feel a gentle stretch in the front of your knee or thigh.  Hold for __________ seconds. Slowly slide your heel back to the starting position. Repeat __________ times. Complete this exercise __________ times per day.  STRETCH - Quadriceps, Prone   Lie on your stomach on a firm surface, such as a bed or padded floor.  Bend your right / left knee and grasp your ankle. If you are unable  to reach, your ankle or pant leg, use a belt around your foot to lengthen your reach.  Gently pull your heel toward your buttocks. Your knee should not slide out to the side. You should feel a stretch in the front of your thigh and/or knee.  Hold this position for __________ seconds. Repeat __________ times. Complete this stretch __________ times per day.  STRENGTHENING EXERCISES - Quadriceps Contusion These exercises may help you when beginning to rehabilitate your injury. They may resolve your symptoms with or without further involvement from your physician, physical therapist or athletic trainer. While completing these exercises, remember:   Muscles can gain both the endurance and the strength needed for everyday activities through controlled exercises.  Complete these exercises as instructed by your physician, physical therapist or athletic trainer. Progress the resistance and repetitions only as guided. STRENGTH - Quadriceps, Isometrics  Lie on your back with your right / left leg extended and your opposite knee bent.  Gradually tense the muscles in the front of your right / left thigh. You should see either your knee cap slide up toward your hip or increased dimpling just above the knee. This motion will push the back of the knee down toward the floor/mat/bed on which you are lying.  Hold the muscle as tight as you can without increasing your pain for __________ seconds.  Relax the muscles slowly and completely in between each repetition. Repeat __________ times. Complete this exercise __________ times per day.  STRENGTH - Quadriceps, Short Arcs   Lie on your back.  Place a __________ inch towel roll under your knee so that the knee slightly bends.  Raise only your lower leg by tightening the muscles in the front of your thigh. Do not allow your thigh to rise.  Hold this position for __________ seconds. Repeat __________ times. Complete this exercise __________ times per day.   OPTIONAL ANKLE WEIGHTS: Begin with ____________________, but DO NOT exceed ____________________. Increase in 1 lb/0.5 kg increments.  STRENGTH - Quadriceps, Straight Leg Raises  Quality counts! Watch for signs that the quadriceps muscle is working to insure you are strengthening the correct muscles and not "cheating" by substituting with healthier muscles.  Lay on your back with your right / left leg extended and your opposite knee bent.  Tense the muscles in the front of your right / left thigh. You should see either your knee cap slide up or increased dimpling just above the knee. Your thigh may even quiver.  Tighten these muscles even more and raise your leg 4 to 6 inches off the floor. Hold for __________ seconds.  Keeping these muscles tense, lower your leg.  Relax the muscles slowly and completely in between each repetition. Repeat __________ times. Complete this exercise __________ times per day.  STRENGTH - Quadriceps, Wall Slides  Follow guidelines for form closely. Increased knee pain often results from poorly placed feet or knees.  Lean against a smooth wall or door and walk your feet out 18-24 inches. Place your feet hip-width apart.  Slowly slide down the wall or door until your knees bend __________ degrees.* Keep your knees over your heels, not your toes, and in line with your hips, not falling to either side.  Hold for __________ seconds. Stand up to rest for __________ seconds in between each repetition. Repeat __________ times. Complete this exercise __________ times per day. * Your physician, physical therapist or athletic trainer will alter this angle based on your symptoms and progress.   This information is not intended to replace advice given to you by your health care provider. Make sure you discuss any questions you have with your health care provider.   Document Released: 02/15/2005 Document Revised: 07/02/2014 Document Reviewed: 05/30/2008 Elsevier Interactive  Patient Education [employer] Elsevier Inc.

## 2015-08-08 ENCOUNTER — Telehealth: Payer: Self-pay

## 2015-08-08 LAB — COMPREHENSIVE METABOLIC PANEL
ALBUMIN: 4.4 g/dL (ref 3.6–5.1)
ALT: 15 U/L (ref 6–29)
AST: 15 U/L (ref 10–35)
Alkaline Phosphatase: 62 U/L (ref 33–130)
BUN: 13 mg/dL (ref 7–25)
CALCIUM: 9.5 mg/dL (ref 8.6–10.4)
CHLORIDE: 101 mmol/L (ref 98–110)
CO2: 25 mmol/L (ref 20–31)
CREATININE: 0.62 mg/dL (ref 0.50–1.05)
Glucose, Bld: 89 mg/dL (ref 65–99)
Potassium: 4.2 mmol/L (ref 3.5–5.3)
Sodium: 141 mmol/L (ref 135–146)
TOTAL PROTEIN: 7.5 g/dL (ref 6.1–8.1)
Total Bilirubin: 0.5 mg/dL (ref 0.2–1.2)

## 2015-08-08 LAB — CK: CK TOTAL: 55 U/L (ref 7–177)

## 2015-08-08 MED ORDER — SUCRALFATE 1 GM/10ML PO SUSP: 1.0000 g | Freq: Three times a day (TID) | ORAL | Status: DC

## 2015-08-08 MED ORDER — ONDANSETRON 4 MG PO TBDP: 4.0000 mg | ORAL_TABLET | Freq: Three times a day (TID) | ORAL | Status: DC | PRN

## 2015-08-08 MED ORDER — OMEPRAZOLE 20 MG PO CPDR: 20.0000 mg | DELAYED_RELEASE_CAPSULE | Freq: Every day | ORAL | Status: AC

## 2015-08-08 MED ORDER — TIZANIDINE HCL 2 MG PO CAPS: 2.0000 mg | ORAL_CAPSULE | Freq: Three times a day (TID) | ORAL | Status: DC | PRN

## 2015-08-08 NOTE — Telephone Encounter (Signed)
Pt son called stating patient was seen yesterday for stomach pain , and the medication is not at the pharmacy   Best number [phone]

## 2015-08-08 NOTE — Telephone Encounter (Signed)
[facility] [address]

## 2015-09-03 ENCOUNTER — Ambulatory Visit (INDEPENDENT_AMBULATORY_CARE_PROVIDER_SITE_OTHER): Payer: BLUE CROSS/BLUE SHIELD

## 2015-09-03 ENCOUNTER — Ambulatory Visit: Payer: BLUE CROSS/BLUE SHIELD | Admitting: Family Medicine

## 2015-09-03 ENCOUNTER — Ambulatory Visit (INDEPENDENT_AMBULATORY_CARE_PROVIDER_SITE_OTHER): Payer: BLUE CROSS/BLUE SHIELD | Admitting: Family Medicine

## 2015-09-03 VITALS — BP 108/72 | HR 65 | Temp 98.2°F | Resp 18 | Ht <= 58 in | Wt 105.6 lb

## 2015-09-03 DIAGNOSIS — M5481 Occipital neuralgia: Secondary | ICD-10-CM

## 2015-09-03 DIAGNOSIS — M542 Cervicalgia: Secondary | ICD-10-CM

## 2015-09-03 DIAGNOSIS — M79601 Pain in right arm: Secondary | ICD-10-CM

## 2015-09-03 DIAGNOSIS — R112 Nausea with vomiting, unspecified: Secondary | ICD-10-CM | POA: Diagnosis not present

## 2015-09-03 DIAGNOSIS — R634 Abnormal weight loss: Secondary | ICD-10-CM

## 2015-09-03 DIAGNOSIS — M79602 Pain in left arm: Secondary | ICD-10-CM

## 2015-09-03 DIAGNOSIS — G44219 Episodic tension-type headache, not intractable: Secondary | ICD-10-CM

## 2015-09-03 DIAGNOSIS — G4700 Insomnia, unspecified: Secondary | ICD-10-CM | POA: Diagnosis not present

## 2015-09-03 DIAGNOSIS — R202 Paresthesia of skin: Secondary | ICD-10-CM

## 2015-09-03 DIAGNOSIS — M79603 Pain in arm, unspecified: Secondary | ICD-10-CM

## 2015-09-03 LAB — COMPREHENSIVE METABOLIC PANEL
ALBUMIN: 4.5 g/dL (ref 3.6–5.1)
ALT: 16 U/L (ref 6–29)
AST: 13 U/L (ref 10–35)
Alkaline Phosphatase: 62 U/L (ref 33–130)
BUN: 10 mg/dL (ref 7–25)
CALCIUM: 9.7 mg/dL (ref 8.6–10.4)
CHLORIDE: 105 mmol/L (ref 98–110)
CO2: 28 mmol/L (ref 20–31)
Creat: 0.64 mg/dL (ref 0.50–1.05)
Glucose, Bld: 97 mg/dL (ref 65–99)
Potassium: 4.5 mmol/L (ref 3.5–5.3)
SODIUM: 143 mmol/L (ref 135–146)
Total Bilirubin: 0.7 mg/dL (ref 0.2–1.2)
Total Protein: 7.5 g/dL (ref 6.1–8.1)

## 2015-09-03 LAB — POCT CBC
GRANULOCYTE PERCENT: 63.1 % (ref 37–80)
HCT, POC: 41.8 % (ref 37.7–47.9)
Hemoglobin: 14.3 g/dL (ref 12.2–16.2)
Lymph, poc: 2.3 (ref 0.6–3.4)
MCH: 29.8 pg (ref 27–31.2)
MCHC: 34.3 g/dL (ref 31.8–35.4)
MCV: 86.8 fL (ref 80–97)
MID (CBC): 0.4 (ref 0–0.9)
MPV: 6.2 fL (ref 0–99.8)
PLATELET COUNT, POC: 342 10*3/uL (ref 142–424)
POC Granulocyte: 4.6 (ref 2–6.9)
POC LYMPH PERCENT: 31.5 %L (ref 10–50)
POC MID %: 5.4 % (ref 0–12)
RBC: 4.82 M/uL (ref 4.04–5.48)
RDW, POC: 13.6 %
WBC: 7.3 10*3/uL (ref 4.6–10.2)

## 2015-09-03 LAB — POCT URINALYSIS DIP (MANUAL ENTRY)
BILIRUBIN UA: NEGATIVE
Bilirubin, UA: NEGATIVE
Glucose, UA: NEGATIVE
NITRITE UA: NEGATIVE
PH UA: 6
PROTEIN UA: NEGATIVE
RBC UA: NEGATIVE
Spec Grav, UA: 1.02
UROBILINOGEN UA: 0.2

## 2015-09-03 LAB — POCT SEDIMENTATION RATE: POCT SED RATE: 3 mm/hr (ref 0–22)

## 2015-09-03 LAB — TSH: TSH: 0.71 mIU/L

## 2015-09-03 LAB — VITAMIN B12: VITAMIN B 12: 463 pg/mL (ref 200–1100)

## 2015-09-03 LAB — POC MICROSCOPIC URINALYSIS (UMFC): Mucus: ABSENT

## 2015-09-03 LAB — LIPASE: Lipase: 29 U/L (ref 7–60)

## 2015-09-03 MED ORDER — BUTALBITAL-APAP-CAFFEINE 50-325-40 MG PO TABS: 1.0000 | ORAL_TABLET | Freq: Four times a day (QID) | ORAL | Status: DC | PRN

## 2015-09-03 MED ORDER — TIZANIDINE HCL 2 MG PO CAPS: 2.0000 mg | ORAL_CAPSULE | Freq: Three times a day (TID) | ORAL | Status: DC | PRN

## 2015-09-03 MED ORDER — KETOROLAC TROMETHAMINE 60 MG/2ML IM SOLN
60.0000 mg | Freq: Once | INTRAMUSCULAR | Status: AC
  Administered 2015-09-03: 60 mg via INTRAMUSCULAR

## 2015-09-03 MED ORDER — DIAZEPAM 2 MG PO TABS: 2.0000 mg | ORAL_TABLET | Freq: Every evening | ORAL | Status: AC | PRN

## 2015-09-03 NOTE — Progress Notes (Addendum)
Subjective:  By signing my name below, I, Sara Li, attest that this documentation has been prepared under the direction and in the presence of Delman Cheadle, MD. Electronically Signed: Moises Li, [place]. 09/03/2015 , 2:42 PM .  Patient was seen in Room 8 .   Patient ID: Sara Li, female    DOB: 1965-11-19, 50 y.o.   MRN: [id number] Chief Complaint  Patient presents with  . Leg Pain    both legs, x4 weeks   HPI Sara Li is a 50 y.o. female who presents to [facility] complaining of bilateral leg pain that started a month ago.  She's been having 4 weeks of bilateral leg pain and saw Dr. Tamala Julian for this 1 month prior. When she was seen by Dr. Tamala Julian 1 month ago, she was complaining of bilateral quadriceps pain with standing and sitting without lumbar pain; as well as multiple other complaints at that time including thoracic back pain, abdominal pain, nausea vomiting and diarrhea. Patient was diagnosed with bilateral low back pain, myalgias, and quadriceps tightness. Xrays of her lumbar spine showed no acute or significant chronic bony abnormality, no degenerative change noted; left femur film was also normal. Li work was normal significant for ESR of 2 and a CK of 55 in addition to normal CBC and CMP. Patient was tried on zanaflex, and given home exercises.   Patient reports having right sided headache (located in her occipital groove) that started 4 days ago. Normally, she would have relief taking tylenol. She's been having trouble with sleep, feeling fatigue, ongoing for a week and has been taking advil PM. She denies vision changes, or worsening with bright lights or loud noises.   She also complains having myalgia in the past 2 days, thinking it might be flu. She denies fever or chills.   She mentions nausea, believes this is due to GI issue. She plans to see her GI doctor tomorrow. She's been having loss of appetite and has lost about 15 lbs in the past 10 weeks. She notes discomfort  in her throat as well, especially after vomiting. She reports normal taste and denies burning of her tongue. She's been taking advil PM, last taken 2-3 days ago. She's also been taking the medications prescribed to her. She's brought a couple medications with her today: carafate, omeprazole, tizanidine, and gas ex. She also has zofran, vitamins and supplements at home.   Patient's primary language is [place] and an interpreter was called for translation.   Past Medical History  Diagnosis Date  . Empyema, right [facility])    Prior to Admission medications   Medication Sig Start Date End Date Taking? Authorizing Provider  acetaminophen (TYLENOL) 500 MG tablet Take 500 mg by mouth every 6 (six) hours as needed for mild pain. Reported on 06/27/2015   Yes Historical Provider, MD  gabapentin (NEURONTIN) 300 MG capsule Take 300 mg by mouth at bedtime. Reported on 06/27/2015   Yes Historical Provider, MD  Multiple Vitamins-Minerals (MULTIVITAMIN WITH MINERALS) tablet Take 1 tablet by mouth daily.   Yes Historical Provider, MD  omeprazole (PRILOSEC) 20 MG capsule Take 1 capsule (20 mg total) by mouth daily. 08/08/15  Yes Wardell Honour, MD  ondansetron (ZOFRAN-ODT) 4 MG disintegrating tablet Take 1-2 tablets (4-8 mg total) by mouth every 8 (eight) hours as needed for nausea. 08/08/15  Yes Wardell Honour, MD  sucralfate (CARAFATE) 1 GM/10ML suspension Take 10 mLs (1 g total) by mouth 4 (four) times daily -  with meals and at bedtime. 08/08/15  Yes Wardell Honour, MD  tizanidine (ZANAFLEX) 2 MG capsule Take 1 capsule (2 mg total) by mouth 3 (three) times daily as needed for muscle spasms. 08/08/15  Yes Wardell Honour, MD   No Known Allergies  Review of Systems  Constitutional: Positive for appetite change, fatigue and unexpected weight change. Negative for fever and chills.  HENT: Positive for sore throat.   Eyes: Negative for photophobia and visual disturbance.  Gastrointestinal: Positive for nausea and vomiting.    Musculoskeletal: Positive for myalgias.  Neurological: Positive for weakness and headaches. Negative for numbness.       Objective:   Physical Exam  Constitutional: She is oriented to person, place, and time. She appears well-developed and well-nourished. No distress.  HENT:  Head: Normocephalic and atraumatic.  Right Ear: Tympanic membrane is injected. A middle ear effusion is present.  Left Ear: Tympanic membrane is injected. A middle ear effusion is present.  Nose: Mucosal edema (pale) present.  Mouth/Throat: Oropharynx is clear and moist.  tenderness to palpation at right occipital groove, negative spurlings but pain with cervical flexion, and extension  Eyes: EOM are normal. Pupils are equal, round, and reactive to light.  Neck: Neck supple. No thyromegaly present.  Cardiovascular: Normal rate, regular rhythm and normal heart sounds.   Pulmonary/Chest: Effort normal and breath sounds normal. No respiratory distress.  Abdominal: There is no CVA tenderness.  Musculoskeletal: Normal range of motion.  Lymphadenopathy:    She has no cervical adenopathy.  Neurological: She is alert and oriented to person, place, and time.  Reflex Scores:      Tricep reflexes are 2+ on the right side and 2+ on the left side.      Bicep reflexes are 2+ on the right side and 2+ on the left side. Skin: Skin is warm and dry.  Psychiatric: She has a normal mood and affect. Her behavior is normal.  Nursing note and vitals reviewed.   BP 108/72 mmHg  Pulse 65  Temp(Src) 98.2 F (36.8 C) (Oral)  Resp 18  Ht '4\' 10"'  (1.473 m)  Wt 105 lb 9.6 oz (47.9 kg)  BMI 22.08 kg/m2  SpO2 100%  LMP 04/06/2015   No exam data present   Results for orders placed or performed in visit on 09/03/15  POCT CBC  Result Value Ref Range   WBC 7.3 4.6 - 10.2 K/uL   Lymph, poc 2.3 0.6 - 3.4   POC LYMPH PERCENT 31.5 10 - 50 %L   MID (cbc) 0.4 0 - 0.9   POC MID % 5.4 0 - 12 %M   POC Granulocyte 4.6 2 - 6.9    Granulocyte percent 63.1 37 - 80 %G   RBC 4.82 4.04 - 5.48 M/uL   Hemoglobin 14.3 12.2 - 16.2 g/dL   HCT, POC 41.8 37.7 - 47.9 %   MCV 86.8 80 - 97 fL   MCH, POC 29.8 27 - 31.2 pg   MCHC 34.3 31.8 - 35.4 g/dL   RDW, POC 13.6 %   Platelet Count, POC 342 142 - 424 K/uL   MPV 6.2 0 - 99.8 fL  POCT urinalysis dipstick  Result Value Ref Range   Color, UA yellow yellow   Clarity, UA clear clear   Glucose, UA negative negative   Bilirubin, UA negative negative   Ketones, POC UA negative negative   Spec Grav, UA 1.020    Li, UA negative negative   pH,  UA 6.0    Protein Ur, POC negative negative   Urobilinogen, UA 0.2    Nitrite, UA Negative Negative   Leukocytes, UA Trace (A) Negative  POCT Microscopic Urinalysis ([facility])  Result Value Ref Range   WBC,UR,HPF,POC Few (A) None WBC/hpf   RBC,UR,HPF,POC None None RBC/hpf   Bacteria None None, Too numerous to count   Mucus Absent Absent   Epithelial Cells, UR Per Microscopy None None, Too numerous to count cells/hpf   Dg Cervical Spine 2 Or 3 Views  09/03/2015  CLINICAL DATA:  Neck pain causing headache. Arm weakness and numbness for 4 days. EXAM: CERVICAL SPINE - 2-3 VIEW COMPARISON:  None. FINDINGS: There is no evidence of cervical spine fracture or prevertebral soft tissue swelling. Alignment is normal. No other significant bone abnormalities are identified. IMPRESSION: Negative cervical spine radiographs. Electronically Signed   By: Monte Fantasia M.D.   On: 09/03/2015 15:12    Visual Acuity Screening   Right eye Left eye Both eyes  Without correction: '20/40 20/40 20/40 '  With correction:          Assessment & Plan:   1. Episodic tension-type headache, not intractable   2. Loss of weight - monitor - she admits to stress and anorexia from the HA pain - recheck in 1 mos.  3. Non-intractable vomiting with nausea, vomiting of unspecified type   4. Occipital neuralgia of right side - persistent despite attempts at medical therapy  -consider refer to neuro to see if may be a candidate for injection if sxs cont?  5. Paresthesia and pain of both upper extremities   6. Cervical pain (neck) - normal xray so agree likely muscular but pt reports no relief with zanaflex. Certainly the language/cultural barrier is complicating but pt asking for something stronger - will do short term trial of low dose valium - explained risks of dependence/addiction in detail and pt aware so will use sparingly and RTC for refills if needed.  7. Insomnia     Orders Placed This Encounter  Procedures  . DG Cervical Spine 2 or 3 views    Standing Status: Future     Number of Occurrences: 1     Standing Expiration Date: 09/02/2016    Order Specific Question:  Reason for Exam (SYMPTOM  OR DIAGNOSIS REQUIRED)    Answer:  neck pain causing headache, also with arm weakness and numbness    Order Specific Question:  Is the patient pregnant?    Answer:  No    Order Specific Question:  Preferred imaging location?    Answer:  External  . Comprehensive metabolic panel  . Lipase  . TSH  . Vitamin B12  . VITAMIN D 25 Hydroxy (Vit-D Deficiency, Fractures)  . POCT CBC  . POCT SEDIMENTATION RATE  . POCT urinalysis dipstick  . POCT Microscopic Urinalysis ([facility])    Meds ordered this encounter  Medications  . ketorolac (TORADOL) injection 60 mg    Sig:   . butalbital-acetaminophen-caffeine (FIORICET) 50-325-40 MG tablet    Sig: Take 1-2 tablets by mouth every 6 (six) hours as needed for headache.    Dispense:  20 tablet    Refill:  0  . diazepam (VALIUM) 2 MG tablet    Sig: Take 1-2 tablets (2-4 mg total) by mouth at bedtime as needed for muscle spasms or sedation.    Dispense:  30 tablet    Refill:  0  . tizanidine (ZANAFLEX) 2 MG capsule    Sig: Take  1 capsule (2 mg total) by mouth 3 (three) times daily as needed for muscle spasms. May take 2-3 tabs po qhs    Dispense:  60 capsule    Refill:  0   Over 40 min spent in face-to-face evaluation of  and consultation with patient and coordination of care.  Over 50% of this time was spent counseling this patient.  Language level caveat.  I personally performed the services described in this documentation, which was scribed in my presence. The recorded information has been reviewed and considered, and addended by me as needed.   Delman Cheadle, M.D.  [facility] [address] [place], [place] [address] [phone] phone [phone] fax  09/09/2015 4:04 PM

## 2015-09-03 NOTE — Patient Instructions (Addendum)
IF you received an x-ray today, you will receive an invoice from [facility] Please contact [facility] at [phone] with questions or concerns regarding your invoice.   IF you received labwork today, you will receive an invoice from [facility]. Please contact [facility] at [phone] with questions or concerns regarding your invoice.   Our billing staff will not be able to assist you with questions regarding bills from these companies.  You will be contacted with the lab results as soon as they are available. The fastest way to get your results is to activate your My Chart account. Instructions are located on the last page of this paperwork. If you have not heard from us regarding the results in 2 weeks, please contact this office.    Occipital Neuralgia Occipital neuralgia is a type of headache that causes episodes of very bad pain in the back of your head. Pain from occipital neuralgia may spread (radiate) to other parts of your head. The pain is usually brief and often goes away after you rest and relax. These headaches may be caused by irritation of the nerves that leave your spinal cord high up in your neck, just below the base of your skull (occipital nerves). Your occipital nerves transmit sensations from the back of your head, the top of your head, and the areas behind your ears. CAUSES Occipital neuralgia can occur without any known cause (primary headache syndrome). In other cases, occipital neuralgia is caused by pressure on or irritation of one of the two occipital nerves. Causes of occipital nerve compression or irritation include:  Wear and tear of the vertebrae in the neck (osteoarthritis).  Neck injury.  Disease of the disks that separate the vertebrae.  Tumors.  Gout.  Infections.  Diabetes.  Swollen blood vessels that put pressure on the occipital nerves.  Muscle spasm in the neck. SIGNS AND SYMPTOMS Pain is  the main symptom of occipital neuralgia. It usually starts in the back of the head but may also be felt in other areas supplied by the occipital nerves. Pain is usually on one side but may be on both sides. You may have:   Brief episodes of very bad pain that is burning, stabbing, shocking, or shooting.  Pain behind the eye.  Pain triggered by neck movement or hair brushing.  Scalp tenderness.  Aching in the back of the head between episodes of very bad pain. DIAGNOSIS  Your health care provider may diagnose occipital neuralgia based on your symptoms and a physical exam. During the exam, the health care provider may push on areas supplied by the occipital nerves to see if they are painful. Some tests may also be done to help in making the diagnosis. These may include:  Imaging studies of the upper spinal cord, such as an MRI or CT scan. These may show compression or spinal cord abnormalities.  Nerve block. You will get an injection of numbing medicine (local anesthetic) near the occipital nerve to see if this relieves pain. TREATMENT  Treatment may begin with simple measures, such as:   Rest.  Massage.  Heat.  Over-the-counter pain relievers. If these measures do not work, you may need other treatments, including:  Medicines such as:  Prescription-strength anti-inflammatory medicines.  Muscle relaxants.  Antiseizure medicines.  Antidepressants.  Steroid injection. This involves injections of local anesthetic and strong anti-inflammatory drugs (steroids).  Pulsed radiofrequency. Wires are implanted to deliver electrical impulses that block pain signals from the occipital  nerve.  Physical therapy.  Surgery to relieve nerve pressure. HOME CARE INSTRUCTIONS  Take all medicines as directed by your health care provider.  Avoid activities that cause pain.  Rest when you have an attack of pain.  Try gentle massage or a heating pad to relieve pain.  Work with a physical  therapist to learn stretching exercises you can do at home.  Try a different pillow or sleeping position.  Practice good posture.  Try to stay active. Get regular exercise that does not cause pain. Ask your health care provider to suggest safe exercises for you.  Keep all follow-up visits as directed by your health care provider. This is important. SEEK MEDICAL CARE IF:  Your medicine is not working.  You have new or worsening symptoms. SEEK IMMEDIATE MEDICAL CARE IF:  You have very bad head pain that is not going away.  You have a sudden change in vision, balance, or speech. MAKE SURE YOU:  Understand these instructions.  Will watch your condition.  Will get help right away if you are not doing well or get worse.   This information is not intended to replace advice given to you by your health care provider. Make sure you discuss any questions you have with your health care provider.   Document Released: 02/09/2001 Document Revised: 03/08/2014 Document Reviewed: 02/07/2013 Elsevier Interactive Patient Education [employer]2016 [employer]

## 2015-09-04 LAB — VITAMIN D 25 HYDROXY (VIT D DEFICIENCY, FRACTURES): VIT D 25 HYDROXY: 26 ng/mL — AB (ref 30–100)

## 2015-09-05 ENCOUNTER — Encounter: Payer: Self-pay | Admitting: Family Medicine

## 2015-09-22 ENCOUNTER — Encounter: Payer: Self-pay | Admitting: Family Medicine

## 2015-10-02 ENCOUNTER — Ambulatory Visit (INDEPENDENT_AMBULATORY_CARE_PROVIDER_SITE_OTHER): Payer: BLUE CROSS/BLUE SHIELD | Admitting: Family Medicine

## 2015-10-02 ENCOUNTER — Ambulatory Visit (INDEPENDENT_AMBULATORY_CARE_PROVIDER_SITE_OTHER): Payer: BLUE CROSS/BLUE SHIELD

## 2015-10-02 VITALS — BP 102/70 | HR 84 | Temp 98.5°F | Resp 18 | Ht <= 58 in | Wt 103.4 lb

## 2015-10-02 DIAGNOSIS — Z8709 Personal history of other diseases of the respiratory system: Secondary | ICD-10-CM

## 2015-10-02 DIAGNOSIS — M25561 Pain in right knee: Secondary | ICD-10-CM

## 2015-10-02 DIAGNOSIS — R1013 Epigastric pain: Secondary | ICD-10-CM

## 2015-10-02 DIAGNOSIS — R0600 Dyspnea, unspecified: Secondary | ICD-10-CM

## 2015-10-02 DIAGNOSIS — K2970 Gastritis, unspecified, without bleeding: Secondary | ICD-10-CM | POA: Diagnosis not present

## 2015-10-02 DIAGNOSIS — M25562 Pain in left knee: Secondary | ICD-10-CM | POA: Diagnosis not present

## 2015-10-02 DIAGNOSIS — R208 Other disturbances of skin sensation: Secondary | ICD-10-CM

## 2015-10-02 DIAGNOSIS — K2990 Gastroduodenitis, unspecified, without bleeding: Secondary | ICD-10-CM

## 2015-10-02 DIAGNOSIS — K209 Esophagitis, unspecified without bleeding: Secondary | ICD-10-CM | POA: Insufficient documentation

## 2015-10-02 DIAGNOSIS — R200 Anesthesia of skin: Secondary | ICD-10-CM

## 2015-10-02 DIAGNOSIS — R071 Chest pain on breathing: Secondary | ICD-10-CM | POA: Diagnosis not present

## 2015-10-02 DIAGNOSIS — K529 Noninfective gastroenteritis and colitis, unspecified: Secondary | ICD-10-CM

## 2015-10-02 DIAGNOSIS — R0789 Other chest pain: Secondary | ICD-10-CM

## 2015-10-02 LAB — POCT CBC
Granulocyte percent: 50.7 %G (ref 37–80)
HCT, POC: 39.3 % (ref 37.7–47.9)
Hemoglobin: 13.6 g/dL (ref 12.2–16.2)
Lymph, poc: 2.8 (ref 0.6–3.4)
MCH, POC: 30.5 pg (ref 27–31.2)
MCHC: 34.7 g/dL (ref 31.8–35.4)
MCV: 87.8 fL (ref 80–97)
MID (CBC): 0.4 (ref 0–0.9)
MPV: 6.2 fL (ref 0–99.8)
PLATELET COUNT, POC: 282 10*3/uL (ref 142–424)
POC Granulocyte: 3.3 (ref 2–6.9)
POC LYMPH %: 43.5 % (ref 10–50)
POC MID %: 5.8 %M (ref 0–12)
RBC: 4.48 M/uL (ref 4.04–5.48)
RDW, POC: 12.7 %
WBC: 6.5 10*3/uL (ref 4.6–10.2)

## 2015-10-02 LAB — D-DIMER, QUANTITATIVE: D-Dimer, Quant: <0.19 mcg/mL FEU (ref <0.50)

## 2015-10-02 MED ORDER — SUCRALFATE 1 GM/10ML PO SUSP: 1.0000 g | Freq: Three times a day (TID) | ORAL | 0 refills | Status: DC

## 2015-10-02 MED ORDER — TIZANIDINE HCL 2 MG PO CAPS: 2.0000 mg | ORAL_CAPSULE | Freq: Three times a day (TID) | ORAL | 0 refills | Status: AC | PRN

## 2015-10-02 NOTE — Progress Notes (Addendum)
By signing my name below, I, Sara Li, attest that this documentation has been prepared under the direction and in the presence of Meredith Staggers, MD.  Electronically Signed: Arvilla Market, Medical Scribe. 10/02/15. 5:22 PM.  Subjective:    Patient ID: Sara Li, female    DOB: 11/15/1965, 50 y.o.   MRN: [id number]  HPI Chief Complaint  Patient presents with  . Generalized Body Aches    x2-3 weeks    HPI Comments: Sara Li is a 50 y.o. female who presents to the [facility] complaining of generalized body aches for 2-3 weeks. Pt does not speak English so a video interpreter (interpreter #: [id number]) to help. Pt was just seen 1 week ago by Dr. Clelia Croft with tension HA. It appears she was having leg pain, low back pain, and multiple other areas of pain when seen by Dr. Katrinka Blazing in June. She had a nl SED rate, nl CPK, nl CBC, nl CMP in June. She also had a nl Vit B12, nl TSH, and but a low nl Vit. B6. She was recommended 100 units of Vit D QD based on July 24th note from Dr. Clelia Croft. Pt was prescribed Carafate, Zofran, and Prilosec when seen by Dr. Katrinka Blazing in June. She was also seen in April in the ER. She had a clear CXR, nl troponin, and reassuring EKG. Additionally she had a VATS in Feb by Dr. Zenaida Niece Trgt for an enpyema. She had a postop visit with Dr. Donata Clay May 10th. CXR was clear and throacic surgical issues resolved.  Pt reports episodic right chest pain, and pressure in her right abdomen onset 2-3 months ago that doesn't change when she eats. Pt has SOB when the pain occurs. Pt is not taking Prilosec every day since it was not helping her symptoms. Pt had a endoscopy by [facility] July 7th that had esophagitis and ulcers in the antrum as well as erosions compatible with erosive gastritis. She also had a colonoscopy that had redness and ulcerations comparable with colitis ilietis by Dr. Rocky Crafts. Pt mentions the doctor didn't give her any medications, or change any  treatments after her visit. Pt hasn't called after the visit because she wasn't asked to call back. Pt had n/v before, but she did not have n/v in the past month. Pt denies melena, dizziness, and light-headedness with her chest pain.  Leg Numbness: Pt mentions she experiences numbness in her legs at night onset a month ago. Pt took Tizanidine for her symptoms and it did help her symptoms.  Later in visit other family member present who spoke [place]. States that she did take ibuprofen fairly regularly before evaluation with gastroenterology, but she has not taken that recently. She feels the omeprazole was causing the tingling in her legs, so was recently prescribed pantoprazole, and they have that prescription today to fill.  Patient Active Problem List   Diagnosis Date Noted  . Empyema of right pleural space (HCC) 04/10/2015  . Empyema lung (HCC) 04/10/2015   Past Medical History:  Diagnosis Date  . Empyema, right [facility])    Past Surgical History:  Procedure Laterality Date  . VIDEO ASSISTED THORACOSCOPY (VATS)/EMPYEMA Right 04/10/2015   Procedure: RIGHT VIDEO ASSISTED THORACOSCOPY (VATS)/EMPYEMA;  Surgeon: Kerin Perna, MD;  Location: [facility] OR;  Service: Thoracic;  Laterality: Right;   No Known Allergies Prior to Admission medications   Medication Sig Start Date End Date Taking? Authorizing Provider  acetaminophen (TYLENOL) 500 MG tablet Take  500 mg by mouth every 6 (six) hours as needed for mild pain. Reported on 06/27/2015   Yes Historical Provider, MD  diazepam (VALIUM) 2 MG tablet Take 1-2 tablets (2-4 mg total) by mouth at bedtime as needed for muscle spasms or sedation. 09/03/15  Yes Sherren Mocha, MD  lansoprazole (PREVACID) 15 MG capsule Take 15 mg by mouth daily at 12 noon.   Yes Historical Provider, MD  Multiple Vitamins-Minerals (MULTIVITAMIN WITH MINERALS) tablet Take 1 tablet by mouth daily.   Yes Historical Provider, MD  tizanidine (ZANAFLEX) 2 MG capsule Take 1 capsule (2 mg total)  by mouth 3 (three) times daily as needed for muscle spasms. May take 2-3 tabs po qhs 09/03/15  Yes Sherren Mocha, MD  butalbital-acetaminophen-caffeine (FIORICET) [phone] MG tablet Take 1-2 tablets by mouth every 6 (six) hours as needed for headache. Patient not taking: Reported on 10/02/2015 09/03/15 09/02/16  Sherren Mocha, MD  omeprazole (PRILOSEC) 20 MG capsule Take 1 capsule (20 mg total) by mouth daily. Patient not taking: Reported on 10/02/2015 08/08/15   Ethelda Chick, MD  ondansetron (ZOFRAN-ODT) 4 MG disintegrating tablet Take 1-2 tablets (4-8 mg total) by mouth every 8 (eight) hours as needed for nausea. Patient not taking: Reported on 10/02/2015 08/08/15   Ethelda Chick, MD  sucralfate (CARAFATE) 1 GM/10ML suspension Take 10 mLs (1 g total) by mouth 4 (four) times daily -  with meals and at bedtime. Patient not taking: Reported on 10/02/2015 08/08/15   Ethelda Chick, MD   Social History   Social History  . Marital status: Married    Spouse name: N/A  . Number of children: N/A  . Years of education: N/A   Occupational History  . Not on file.   Social History Main Topics  . Smoking status: Never Smoker  . Smokeless tobacco: Not on file  . Alcohol use No  . Drug use: No  . Sexual activity: Not on file   Other Topics Concern  . Not on file   Social History Narrative  . No narrative on file   Depression screen [facility] 2/9 09/03/2015 06/27/2015 04/06/2015  Decreased Interest 0 0 0  Down, Depressed, Hopeless 0 0 0  PHQ - 2 Score 0 0 0   Review of Systems  Respiratory: Positive for shortness of breath.   Cardiovascular: Positive for chest pain. Negative for leg swelling.  Gastrointestinal: Positive for abdominal pain. Negative for blood in stool, nausea and vomiting.  Musculoskeletal: Positive for myalgias.  Neurological: Positive for numbness. Negative for dizziness and light-headedness.   Objective:  Physical Exam  Constitutional: She appears well-developed and well-nourished. No distress.    HENT:  Head: Normocephalic and atraumatic.  Eyes: Conjunctivae are normal.  Neck: Neck supple.  Cardiovascular: Normal rate.   Pulmonary/Chest: Effort normal.  Abdominal: Bowel sounds are normal. There is tenderness in the epigastric area.  She describes the pain location to lower mid chest line to epigastric pain She describes the pain from the epigastric area to chest wall to mid abdomen to lower abdomen  Neurological: She is alert.  Reflex Scores:      Patellar reflexes are 2+ on the right side and 2+ on the left side.      Achilles reflexes are 2+ on the right side and 2+ on the left side. Skin: Skin is warm and dry.  Psychiatric: She has a normal mood and affect. Her behavior is normal.  Nursing note and vitals reviewed.  BP 102/70   Pulse 84   Temp 98.5 F (36.9 C) (Oral)   Resp 18   Ht  (1.473 m)   Wt 103 lb 6.4 oz (46.9 kg)   LMP 04/06/2015   SpO2 99%   BMI 21.61 kg/m    EKG Reading: Nl sinus rhythm, no acute changes  Results for orders placed or performed in visit on 10/02/15  POCT CBC  Result Value Ref Range   WBC 6.5 4.6 - 10.2 K/uL   Lymph, poc 2.8 0.6 - 3.4   POC LYMPH PERCENT 43.5 10 - 50 %L   MID (cbc) 0.4 0 - 0.9   POC MID % 5.8 0 - 12 %M   POC Granulocyte 3.3 2 - 6.9   Granulocyte percent 50.7 37 - 80 %G   RBC 4.48 4.04 - 5.48 M/uL   Hemoglobin 13.6 12.2 - 16.2 g/dL   HCT, POC 40.9 81.1 - 47.9 %   MCV 87.8 80 - 97 fL   MCH, POC 30.5 27 - 31.2 pg   MCHC 34.7 31.8 - 35.4 g/dL   RDW, POC 91.4 %   Platelet Count, POC 282 142 - 424 K/uL   MPV 6.2 0 - 99.8 fL   6:38 PM Spoke with gastroenterologist on call for Dr. Rocky Crafts.  Advised to continue PPI, okay with starting Carafate, and their office will call her tomorrow to be seen tomorrow.  Assessment & Plan:    SHAYLYNN NULTY is a 50 y.o. female Abdominal pain, epigastric - Plan: POCT CBC, DG Abd Acute W/Chest, sucralfate (CARAFATE) 1 GM/10ML suspension Gastritis and gastroduodenitis - Plan: POCT  CBC, DG Abd Acute W/Chest, sucralfate (CARAFATE) 1 GM/10ML suspension Ileitis - Plan: sucralfate (CARAFATE) 1 GM/10ML suspension Anterior chest wall pain - Plan: EKG 12-Lead, D-dimer, quantitative (not at [facility]), DG Chest 1 View Dyspnea - Plan: EKG 12-Lead, D-dimer, quantitative (not at [facility]), DG Chest 1 View History of empyema of pleura - Plan: DG Chest 1 View Esophagitis - Plan: sucralfate (CARAFATE) 1 GM/10ML suspension  - Based on chronicity of pain, and location, appears to be due to previously diagnosed gastritis, ileitis, and gastric erosions noted on endoscopy. This was likely due to NSAID use. She is now no longer taking NSAIDs, but persistent pain. Discussed with the gastroenterologist on-call for Dr. Rocky Crafts. Maryclare Labrador start PPI pantoprazole, add Carafate 4 times per day, and their office will see her tomorrow.   -History of empyema. Reassuring CBC, x-ray. Recent follow-up visit with cardiothoracic surgeon without signs of recurrence.  -Overnight ER precautions discussed.  -plan discussed with pt through interpreter. She discussed possible other GI to eval, but reinforced plan to follow up with GI tomorrow, then if 2nd opinion desired, we can refer her elsewhere. To ER tonight if any worsening of pain. Understanding expressed with interpreter.   Arthralgia of both lower legs - Plan: tizanidine (ZANAFLEX) 2 MG capsule Numbness of legs - Plan: tizanidine (ZANAFLEX) 2 MG capsule  -Agreed to temporarily refill Zanaflex as this helped her symptoms previously, but advised need to return to discuss the symptoms further as ongoing issue without acute changes. Previous lab work and evaluation reviewed without concerning findings. Borderline vitamin D unlikely cause of leg pain/numbness.   Language barrier  -Video interpreting service used and son present who spoke English later in visit.   Meds ordered this encounter  Medications  . DISCONTD: lansoprazole (PREVACID) 15 MG capsule    Sig: Take 15 mg by  mouth daily at 12  noon.  . sucralfate (CARAFATE) 1 GM/10ML suspension    Sig: Take 10 mLs (1 g total) by mouth 4 (four) times daily -  with meals and at bedtime.    Dispense:  420 mL    Refill:  0  . tizanidine (ZANAFLEX) 2 MG capsule    Sig: Take 1 capsule (2 mg total) by mouth 3 (three) times daily as needed for muscle spasms. May take 2-3 tabs po qhs    Dispense:  30 capsule    Refill:  0   Patient Instructions   Dr. Gabriel Rainwater office will call you to be seen there tomorrow.  Start pantoprazole one per day, and restart carafate 4 times per day.  If any worsening overnight - go to emergency room.   Follow up in next 2 weeks to discuss leg pain and numbness.  Tizanidine as needed for now.   Return to the clinic or go to the nearest emergency room if any of your symptoms worsen or new symptoms occur.   IF you received an x-ray today, you will receive an invoice from [facility]. Please contact [facility] at [phone] with questions or concerns regarding your invoice.   IF you received labwork today, you will receive an invoice from [employer]. Please contact [facility] at [phone] with questions or concerns regarding your invoice.   Our billing staff will not be able to assist you with questions regarding bills from these companies.  You will be contacted with the lab results as soon as they are available. The fastest way to get your results is to activate your My Chart account. Instructions are located on the last page of this paperwork. If you have not heard from [place] regarding the results in 2 weeks, please contact this office.     ?au b?ng, Ng??i l?n (Abdominal Pain, Adult) C nhi?u nguyn nhn d?n ??n ?au b?ng. Thng th??ng ?au b?ng l do m?t b?nh gy ra v s? khng ?? n?u khng ?i?u tr?Marland Kitchen B?nh ny c th? ???c theo di v ?i?u tr? t?i nh. Chuyn gia ch?m [place] s?c kh?e s? ti?n hnh khm th?c th? v c th? yu c?u lm xt nghi?m mu v  ch?p X quang ?? xc ??nh m?c ?? nghim tr?ng c?a c?n ?au b?ng. Tuy nhin, trong nhi?u tr??ng h?p, ph?i m?t nhi?u th?i gian h?n ?? xc ??nh r nguyn nhn gy ?au b?ng. Tr??c khi tm ra nguyn nhn, chuyn gia ch?m [place] s?c kh?e c th? khng bi?t li?u qu v? c c?n lm thm xt nghi?m ho?c ti?p t?c ?i?u tr? hay khng. H??NG D?N CH?M [place] T?I NH  Theo di c?n ?au b?ng xem c b?t k? thay ??i no khng. Nh?ng hnh ??ng sau c th? Li lo?i b? b?t c? c?m gic kh ch?u no qu v? ?ang b?.  Ch? s? d?ng thu?c khng c?n k ??n ho?c thu?c c?n k ??n theo ch? d?n c?a chuyn gia ch?m [place] s?c kh?e.  Khng dng thu?c nhu?n trng tr? khi ???c chuyn gia ch?m [place] s?c kh?e ch? ??nh.  Th? dng m?t ch? ?? ?n l?ng (n??c lu?c th?t, tr, ho?c n??c) theo ch? d?n c?a chuyn gia ch?m [place] s?c kh?e. Chuy?n d?n sang m?t ch? ?? ?n nh? n?u ch?u ???c. ?I KHM N?U:  Qu v? b? ?au b?ng khng r nguyn nhn.  Qu v? b? ?au b?ng km theo bu?n nn ho?c tiu ch?y.  Qu v? b? ?au khi ?i ti?u ho?c ?i ngoi.  Qu v? b? c?n ?au b?ng lm th?c gi?c vo ban ?m.  Qu v? b? ?au b?ng n?ng thm ho?c ?? h?n khi ?n.  Qu v? b? ?au b?ng n?ng thm khi ?n ?? ?n nhi?u ch?t bo.  Qu v? b? s?t. NGAY L?P T?C ?I KHM N?U:   C?n ?au khng kh?i trong vng 2 gi?Ladell Heads v? v?n ti?p t?c b? i (nn m?a).  Ch? c th? c?m th?y ?au ? m?t s? ph?n b?ng, ch?ng h?n nh? ? ph?n b?ng bn ph?i ho?c bn d??i tri.  Phn c?a qu v? c mu ho?c c mu ?en nh? h?c n. ??M B?O QU V?:  Hi?u r cc h??ng d?n ny.  S? theo di tnh tr?ng c?a mnh.  S? yu c?u tr? Li ngay l?p t?c n?u qu v? c?m th?y khng kh?e ho?c th?y tr?m tr?ng h?n.   Thng tin ny khng nh?m m?c ?ch thay th? cho l?i khuyn m chuyn gia ch?m [place] s?c kh?e ni v?i qu v?. Hy b?o ??m qu v? ph?i th?o lu?n b?t k? v?n ?? g m qu v? c v?i chuyn gia ch?m [place] s?c kh?e c?a qu v?.   Document Released: 02/15/2005 Document Revised: 03/08/2014 Elsevier Interactive Patient Education [employer].   over 40 minutes spent face to face care including use of translator.   I personally performed the services described in this documentation, which was scribed in my presence. The recorded information has been reviewed and considered, and addended by me as needed.   Signed,   Meredith Staggers, MD [facility].  10/02/15 6:39 PM

## 2015-10-02 NOTE — Patient Instructions (Addendum)
Dr. Gabriel Rainwater office will call you to be seen there tomorrow.  Start pantoprazole one per day, and restart carafate 4 times per day.  If any worsening overnight - go to emergency room.   Follow up in next 2 weeks to discuss leg pain and numbness.  Tizanidine as needed for now.   Return to the clinic or go to the nearest emergency room if any of your symptoms worsen or new symptoms occur.   IF you received an x-ray today, you will receive an invoice from [facility]. Please contact [facility] at [phone] with questions or concerns regarding your invoice.   IF you received labwork today, you will receive an invoice from [employer]. Please contact [facility] at [phone] with questions or concerns regarding your invoice.   Our billing staff will not be able to assist you with questions regarding bills from these companies.  You will be contacted with the lab results as soon as they are available. The fastest way to get your results is to activate your My Chart account. Instructions are located on the last page of this paperwork. If you have not heard from [place] regarding the results in 2 weeks, please contact this office.     ?au b?ng, Ng??i l?n (Abdominal Pain, Adult) C nhi?u nguyn nhn d?n ??n ?au b?ng. Thng th??ng ?au b?ng l do m?t b?nh gy ra v s? khng ?? n?u khng ?i?u tr?Marland Kitchen B?nh ny c th? ???c theo di v ?i?u tr? t?i nh. Chuyn gia ch?m [place] s?c kh?e s? ti?n hnh khm th?c th? v c th? yu c?u lm xt nghi?m mu v ch?p X quang ?? xc ??nh m?c ?? nghim tr?ng c?a c?n ?au b?ng. Tuy nhin, trong nhi?u tr??ng h?p, ph?i m?t nhi?u th?i gian h?n ?? xc ??nh r nguyn nhn gy ?au b?ng. Tr??c khi tm ra nguyn nhn, chuyn gia ch?m [place] s?c kh?e c th? khng bi?t li?u qu v? c c?n lm thm xt nghi?m ho?c ti?p t?c ?i?u tr? hay khng. H??NG D?N CH?M [place] T?I NH  Theo di c?n ?au b?ng xem c b?t k? thay ??i no khng. Nh?ng hnh ??ng sau c th? gip  lo?i b? b?t c? c?m gic kh ch?u no qu v? ?ang b?.  Ch? s? d?ng thu?c khng c?n k ??n ho?c thu?c c?n k ??n theo ch? d?n c?a chuyn gia ch?m [place] s?c kh?e.  Khng dng thu?c nhu?n trng tr? khi ???c chuyn gia ch?m [place] s?c kh?e ch? ??nh.  Th? dng m?t ch? ?? ?n l?ng (n??c lu?c th?t, tr, ho?c n??c) theo ch? d?n c?a chuyn gia ch?m [place] s?c kh?e. Chuy?n d?n sang m?t ch? ?? ?n nh? n?u ch?u ???c. ?I KHM N?U:  Qu v? b? ?au b?ng khng r nguyn nhn.  Qu v? b? ?au b?ng km theo bu?n nn ho?c tiu ch?y.  Qu v? b? ?au khi ?i ti?u ho?c ?i ngoi.  Qu v? b? c?n ?au b?ng lm th?c gi?c vo ban ?m.  Qu v? b? ?au b?ng n?ng thm ho?c ?? h?n khi ?n.  Qu v? b? ?au b?ng n?ng thm khi ?n ?? ?n nhi?u ch?t bo.  Qu v? b? s?t. NGAY L?P T?C ?I KHM N?U:   C?n ?au khng kh?i trong vng 2 gi?Ladell Heads v? v?n ti?p t?c b? i (nn m?a).  Ch? c th? c?m th?y ?au ? m?t s? ph?n b?ng, ch?ng h?n nh? ? ph?n b?ng bn ph?i ho?c bn d??i tri.  Phn  c?a qu v? c mu ho?c c mu ?en nh? h?c n. ??M B?O QU V?:  Hi?u r cc h??ng d?n ny.  S? theo di tnh tr?ng c?a mnh.  S? yu c?u tr? gip ngay l?p t?c n?u qu v? c?m th?y khng kh?e ho?c th?y tr?m tr?ng h?n.   Thng tin ny khng nh?m m?c ?ch thay th? cho l?i khuyn m chuyn gia ch?m [place] s?c kh?e ni v?i qu v?. Hy b?o ??m qu v? ph?i th?o lu?n b?t k? v?n ?? g m qu v? c v?i chuyn gia ch?m [place] s?c kh?e c?a qu v?.   Document Released: 02/15/2005 Document Revised: 03/08/2014 Elsevier Interactive Patient Education [employer]

## 2015-10-13 ENCOUNTER — Encounter: Payer: Self-pay | Admitting: *Deleted

## 2015-12-16 ENCOUNTER — Emergency Department (HOSPITAL_COMMUNITY)
Admission: EM | Admit: 2015-12-16 | Discharge: 2015-12-17 | Disposition: A | Discharge status: Home / Self Care | Payer: BLUE CROSS/BLUE SHIELD | Attending: Emergency Medicine | Admitting: Emergency Medicine

## 2015-12-16 ENCOUNTER — Emergency Department (HOSPITAL_COMMUNITY): Payer: BLUE CROSS/BLUE SHIELD

## 2015-12-16 ENCOUNTER — Encounter (HOSPITAL_COMMUNITY): Payer: Self-pay | Admitting: *Deleted

## 2015-12-16 DIAGNOSIS — R079 Chest pain, unspecified: Secondary | ICD-10-CM | POA: Diagnosis present

## 2015-12-16 DIAGNOSIS — R109 Unspecified abdominal pain: Secondary | ICD-10-CM | POA: Diagnosis not present

## 2015-12-16 LAB — BASIC METABOLIC PANEL
Anion gap: 9 (ref 5–15)
BUN: 8 mg/dL (ref 6–20)
CALCIUM: 9.3 mg/dL (ref 8.9–10.3)
CO2: 25 mmol/L (ref 22–32)
CREATININE: 0.62 mg/dL (ref 0.44–1.00)
Chloride: 105 mmol/L (ref 101–111)
GFR calc Af Amer: >60 mL/min (ref >60)
GLUCOSE: 97 mg/dL (ref 65–99)
Potassium: 3.7 mmol/L (ref 3.5–5.1)
Sodium: 139 mmol/L (ref 135–145)

## 2015-12-16 LAB — CBC
HEMATOCRIT: 41.3 % (ref 36.0–46.0)
Hemoglobin: 13.5 g/dL (ref 12.0–15.0)
MCH: 29.7 pg (ref 26.0–34.0)
MCHC: 32.7 g/dL (ref 30.0–36.0)
MCV: 90.8 fL (ref 78.0–100.0)
PLATELETS: 248 10*3/uL (ref 150–400)
RBC: 4.55 MIL/uL (ref 3.87–5.11)
RDW: 12.4 % (ref 11.5–15.5)
WBC: 7.2 10*3/uL (ref 4.0–10.5)

## 2015-12-16 LAB — I-STAT TROPONIN, ED: TROPONIN I, POC: 0 ng/mL (ref 0.00–0.08)

## 2015-12-16 MED ORDER — GI COCKTAIL ~~LOC~~
30.0000 mL | Freq: Once | ORAL | Status: AC
  Administered 2015-12-16: 30 mL via ORAL
  Filled 2015-12-16: qty 30

## 2015-12-16 NOTE — ED Triage Notes (Signed)
Pt is here for epigastric pain that wraps around to her back as well as central back pain and sob.  Pt has been evaluated for this by GI and was told everything is normal and was told to take medication and be patient but symptoms are not getting better.  Triage done using [employer] interpreter

## 2015-12-17 LAB — HEPATIC FUNCTION PANEL
ALK PHOS: 61 U/L (ref 38–126)
ALT: 9 U/L — AB (ref 14–54)
AST: 16 U/L (ref 15–41)
Albumin: 4.3 g/dL (ref 3.5–5.0)
BILIRUBIN TOTAL: 0.5 mg/dL (ref 0.3–1.2)
Total Protein: 7.3 g/dL (ref 6.5–8.1)

## 2015-12-17 LAB — LIPASE, BLOOD: Lipase: 34 U/L (ref 11–51)

## 2015-12-17 MED ORDER — SUCRALFATE 1 GM/10ML PO SUSP: 1.0000 g | Freq: Two times a day (BID) | ORAL | 0 refills | Status: AC

## 2015-12-17 MED ORDER — GI COCKTAIL ~~LOC~~
30.0000 mL | Freq: Once | ORAL | Status: AC
  Administered 2015-12-17: 30 mL via ORAL
  Filled 2015-12-17: qty 30

## 2015-12-17 MED ORDER — ONDANSETRON 4 MG PO TBDP
8.0000 mg | ORAL_TABLET | Freq: Once | ORAL | Status: AC
  Administered 2015-12-17: 8 mg via ORAL
  Filled 2015-12-17: qty 2

## 2015-12-17 MED ORDER — FAMOTIDINE 20 MG PO TABS
20.0000 mg | ORAL_TABLET | Freq: Once | ORAL | Status: AC
  Administered 2015-12-17: 20 mg via ORAL
  Filled 2015-12-17: qty 1

## 2015-12-17 NOTE — ED Notes (Signed)
At bedside with Dr. Bebe ShaggyWickline examining patient. Phone interpreter in use at this time

## 2015-12-17 NOTE — ED Provider Notes (Signed)
[facility]-EMERGENCY DEPT Provider Note   CSN: [id number] Arrival date & time: 12/16/15  7829  By signing my name below, I, Nelwyn Salisbury, attest that this documentation has been prepared under the direction and in the presence of Zadie Rhine, MD . Electronically Signed: Nelwyn Salisbury, Scribe. 12/17/2015. 12:33 AM.  History   Chief Complaint Chief Complaint  Patient presents with  . Chest Pain   The history is provided by the patient. A language interpreter was used.  Chest Pain   This is a recurrent problem. The current episode started more than 1 week ago. The problem occurs constantly. The problem has been gradually worsening. The pain is associated with eating. The pain is present in the epigastric region. The pain is mild. The pain radiates to the upper back, right neck and left neck. Associated symptoms include abdominal pain, cough, headaches and shortness of breath. Pertinent negatives include no fever, no lower extremity edema, no vomiting and no weakness. Treatments tried: Protonix. The treatment provided no relief. There are no known risk factors. Past medical history comments: Hiatal hernia, Peptic ulcer, esophagitis Past workup comments: Lung Surgery, EGD.    HPI Comments:  Sara Li is a 50 y.o. female who presents to the Emergency Department complaining of recurrent intermittent worsening chest pain beginning 2 months ago. She states her pain is located in the center of her chest and radiating around to her back and up to her neck. She notes that her pain is worsened slightly by eating. Pt notes that she has been evaluated for similar pain in July 2017 by GI where she had an EGD and was told she had a hiatal hernia, peptic ulcers and esophagitis. She was given Protonix to help with her symptoms, but she returns today having had no relief. She reports associated chills, headaches, coughing, some abdominal pain, and shortness of breath. Pt denies any fever, vomiting, leg swelling,  or weakness.   [place] interpreter #: [phone]   Past Medical History:  Diagnosis Date  . Empyema, right [facility])     Patient Active Problem List   Diagnosis Date Noted  . Esophagitis 10/02/2015  . Ileitis 10/02/2015  . Empyema of right pleural space (HCC) 04/10/2015  . Empyema lung (HCC) 04/10/2015    Past Surgical History:  Procedure Laterality Date  . VIDEO ASSISTED THORACOSCOPY (VATS)/EMPYEMA Right 04/10/2015   Procedure: RIGHT VIDEO ASSISTED THORACOSCOPY (VATS)/EMPYEMA;  Surgeon: Kerin Perna, MD;  Location: [facility] OR;  Service: Thoracic;  Laterality: Right;    OB History    No data available       Home Medications    Prior to Admission medications   Medication Sig Start Date End Date Taking? Authorizing Provider  pantoprazole (PROTONIX) 40 MG tablet Take 40 mg by mouth daily.   Yes Historical Provider, MD  ranitidine (ZANTAC) 150 MG tablet Take 150 mg by mouth daily.   Yes Historical Provider, MD  tizanidine (ZANAFLEX) 2 MG capsule Take 1 capsule (2 mg total) by mouth 3 (three) times daily as needed for muscle spasms. May take 2-3 tabs po qhs 10/02/15  Yes Shade Flood, MD  acetaminophen (TYLENOL) 500 MG tablet Take 500 mg by mouth every 6 (six) hours as needed for mild pain. Reported on 06/27/2015    Historical Provider, MD  butalbital-acetaminophen-caffeine (FIORICET) 50-325-40 MG tablet Take 1-2 tablets by mouth every 6 (six) hours as needed for headache. Patient not taking: Reported on 12/16/2015 09/03/15 09/02/16  Sherren Mocha, MD  diazepam (VALIUM)  2 MG tablet Take 1-2 tablets (2-4 mg total) by mouth at bedtime as needed for muscle spasms or sedation. Patient not taking: Reported on 12/16/2015 09/03/15   Sherren MochaEva N Shaw, MD  omeprazole (PRILOSEC) 20 MG capsule Take 1 capsule (20 mg total) by mouth daily. Patient not taking: Reported on 12/16/2015 08/08/15   Ethelda ChickKristi M Smith, MD  sucralfate (CARAFATE) 1 GM/10ML suspension Take 10 mLs (1 g total) by mouth 4 (four) times daily -  with  meals and at bedtime. Patient not taking: Reported on 12/16/2015 10/02/15   Shade FloodJeffrey R Greene, MD    Family History No family history on file.  Social History Social History  Substance Use Topics  . Smoking status: Never Smoker  . Smokeless tobacco: Never Used  . Alcohol use No     Allergies   Review of patient's allergies indicates no known allergies.   Review of Systems Review of Systems  Constitutional: Positive for chills. Negative for fever.  Respiratory: Positive for cough and shortness of breath.   Cardiovascular: Positive for chest pain. Negative for leg swelling.  Gastrointestinal: Positive for abdominal pain. Negative for vomiting.  Neurological: Positive for headaches. Negative for weakness.  All other systems reviewed and are negative.    Physical Exam Updated Vital Signs BP 146/87 (BP Location: Left Arm)   Pulse (!) 56   Temp 97.7 F (36.5 C) (Oral)   Resp 14   Ht 5\' 1"  (1.549 m)   LMP 04/06/2015   SpO2 100%   Physical Exam CONSTITUTIONAL: Well developed/well nourished HEAD: Normocephalic/atraumatic EYES: EOMI/PERRL, no icterus ENMT: Mucous membranes moist NECK: supple no meningeal signs SPINE/BACK:entire spine nontender CV: S1/S2 noted, no murmurs/rubs/gallops noted LUNGS: Lungs are clear to auscultation bilaterally, no apparent distress ABDOMEN: soft, nontender, no rebound or guarding, bowel sounds noted throughout abdomen GU:no cva tenderness NEURO: Pt is awake/alert/appropriate, moves all extremitiesx4.  No facial droop.   EXTREMITIES: pulses normal/equal, full ROM SKIN: warm, color normal PSYCH: no abnormalities of mood noted, alert and oriented to situation  ED Treatments / Results  DIAGNOSTIC STUDIES:  Oxygen Saturation is 100% on RA, normal by my interpretation.    COORDINATION OF CARE:  12:54 AM Discussed treatment plan with pt at bedside which included a GI cocktail and pt agreed to plan.  Labs (all labs ordered are listed, but  only abnormal results are displayed) Labs Reviewed  HEPATIC FUNCTION PANEL - Abnormal; Notable for the following:       Result Value   ALT 9 (*)    Bilirubin, Direct <0.1 (*)    All other components within normal limits  CBC  BASIC METABOLIC PANEL  LIPASE, BLOOD  I-STAT TROPOININ, ED    EKG  EKG Interpretation  Date/Time:  Wednesday December 17 2015 00:28:09 EDT Ventricular Rate:  53 PR Interval:    QRS Duration: 93 QT Interval:  419 QTC Calculation: 394 R Axis:   83 Text Interpretation:  Sinus rhythm T wave inversion No significant change since last tracing Confirmed by Bebe ShaggyWICKLINE  MD, Caprice Mccaffrey ([id number]) on 12/17/2015 12:32:14 AM       Radiology Dg Chest 2 View  Result Date: 12/16/2015 CLINICAL DATA:  Acute onset of upper back pain, epigastric pain and shortness of breath. Initial encounter. EXAM: CHEST  2 VIEW COMPARISON:  Chest radiograph performed 10/02/2015 FINDINGS: The lungs are well-aerated and clear. There is no evidence of focal opacification, pleural effusion or pneumothorax. The heart is normal in size; the mediastinal contour is within normal limits.  No acute osseous abnormalities are seen. IMPRESSION: No acute cardiopulmonary process seen. Electronically Signed   By: Roanna Raider M.D.   On: 12/16/2015 19:55    Procedures Procedures (including critical care time)  Medications Ordered in ED Medications  gi cocktail (Maalox,Lidocaine,Donnatal) (30 mLs Oral Given 12/16/15 1909)  gi cocktail (Maalox,Lidocaine,Donnatal) (30 mLs Oral Given 12/17/15 0101)  ondansetron (ZOFRAN-ODT) disintegrating tablet 8 mg (8 mg Oral Given 12/17/15 0205)  famotidine (PEPCID) tablet 20 mg (20 mg Oral Given 12/17/15 0205)     Initial Impression / Assessment and Plan / ED Course  I have reviewed the triage vital signs and the nursing notes.  Pertinent labs & imaging results that were available during my care of the patient were reviewed by me and considered in my medical decision  making (see chart for details).  Clinical Course    Pt here with recurrent CP and abd pain She admits this has been present for months but felt she could not take it any longer I utilized interpreter on intiial evaluation and also at time of discharge (#[id number]) however the phone died.  She requested I speak to daughter over the phone.  I spoke to her daughter via phone.  Apparently she has never felt right since she had VATS for empyema previously.  I reassured her the CXR was negative Also having abd pain and has had extensive workup including EGD that revealed esophagitis and also ulcer Will start back on carafate and refer to GI Otherwise will d/c home Given symptoms present for months I doubt ACS/PE/Dissection or acute abdominal emergency Also - she has had stress imaging in past year that was negative  Final Clinical Impressions(s) / ED Diagnoses   Final diagnoses:  Chest pain, unspecified type  Abdominal pain, unspecified abdominal location    New Prescriptions New Prescriptions   SUCRALFATE (CARAFATE) 1 GM/10ML SUSPENSION    Take 10 mLs (1 g total) by mouth 2 (two) times daily.  I personally performed the services described in this documentation, which was scribed in my presence. The recorded information has been reviewed and is accurate.        Zadie Rhine, MD 12/17/15 [phone]

## 2016-07-23 IMAGING — CR DG CHEST 2V
2 series · 2 of 2 positions shown · non-contrast
Comparison: Chest x-ray 06/23/2015.

CLINICAL DATA: 49-year-old female with history of weakness, cough
and vomiting.

EXAM:
CHEST  2 VIEW

[chest pa]
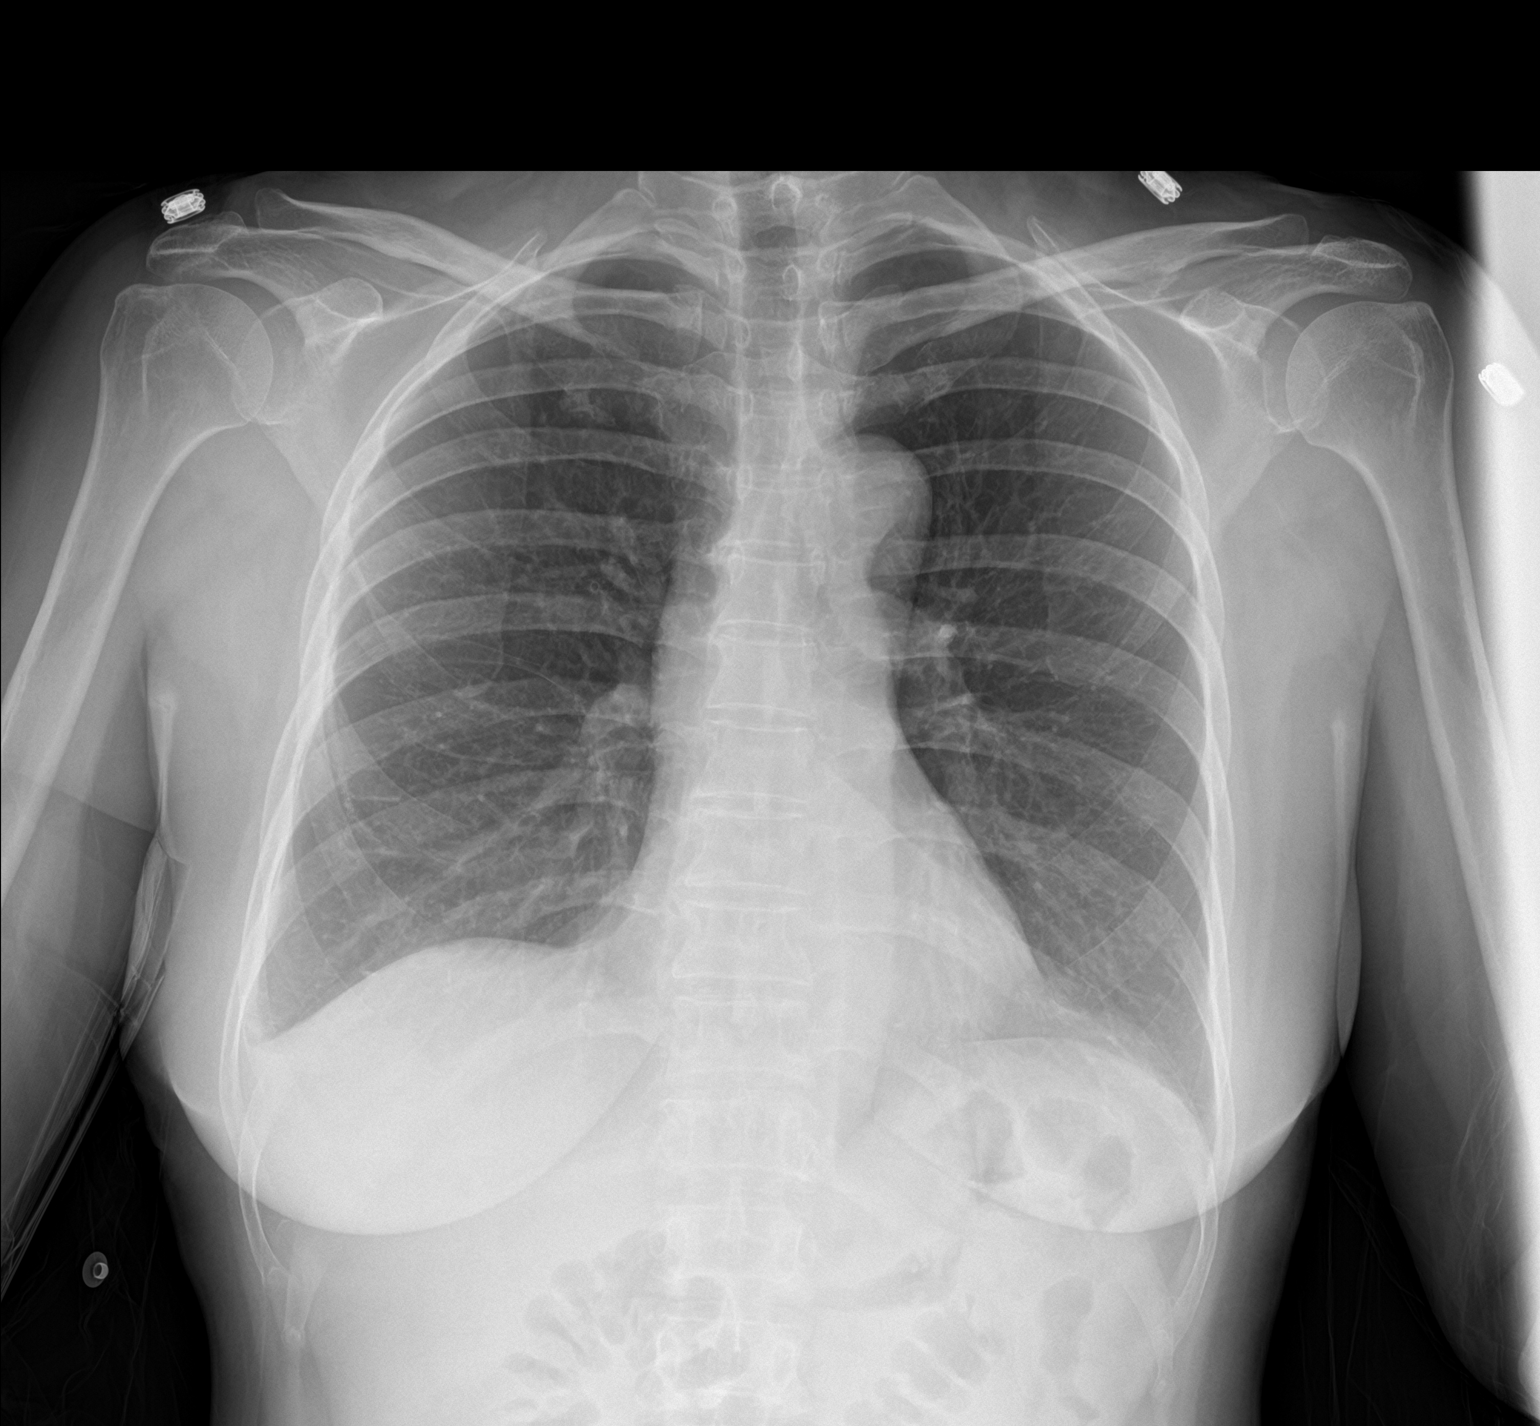

[chest lat]
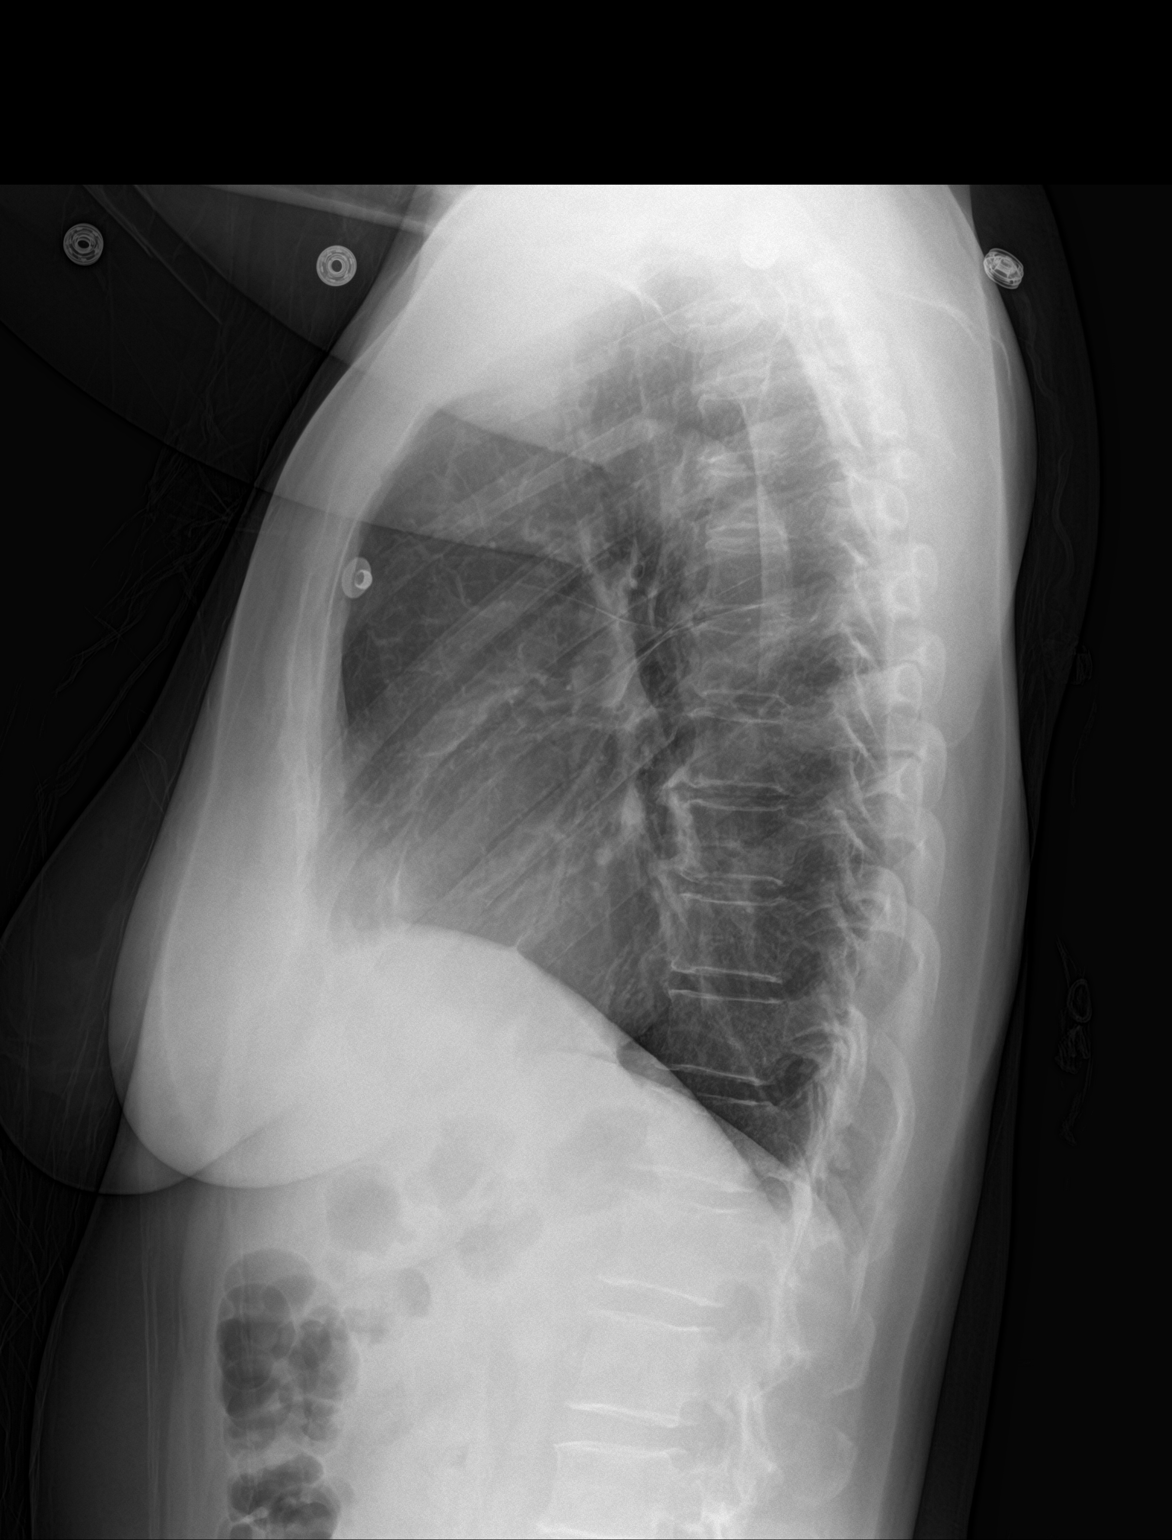

[2 of 2 positions shown; findings below may reference images not displayed]

FINDINGS: Lung volumes are normal. No consolidative airspace disease. No
pleural effusions. No pneumothorax. No pulmonary nodule or mass
noted. Suture line again noted in the lateral aspect of the lower
right lung, presumably from prior wedge resection. Pulmonary
vasculature and the cardiomediastinal silhouette are within normal
limits.
IMPRESSION: No radiographic evidence of acute cardiopulmonary disease.

## 2016-08-02 IMAGING — DX DG LUMBAR SPINE COMPLETE 4+V
5 series · 5 of 5 positions shown · non-contrast
Comparison: Coronal and sagittal reconstructed images through the
lumbar spine from a CT scan of the abdomen and pelvis dated July 28, 2015

CLINICAL DATA: Bilateral low back pain without sciatic symptoms

EXAM:
LUMBAR SPINE - COMPLETE 4+ VIEW

[l-spine ap]
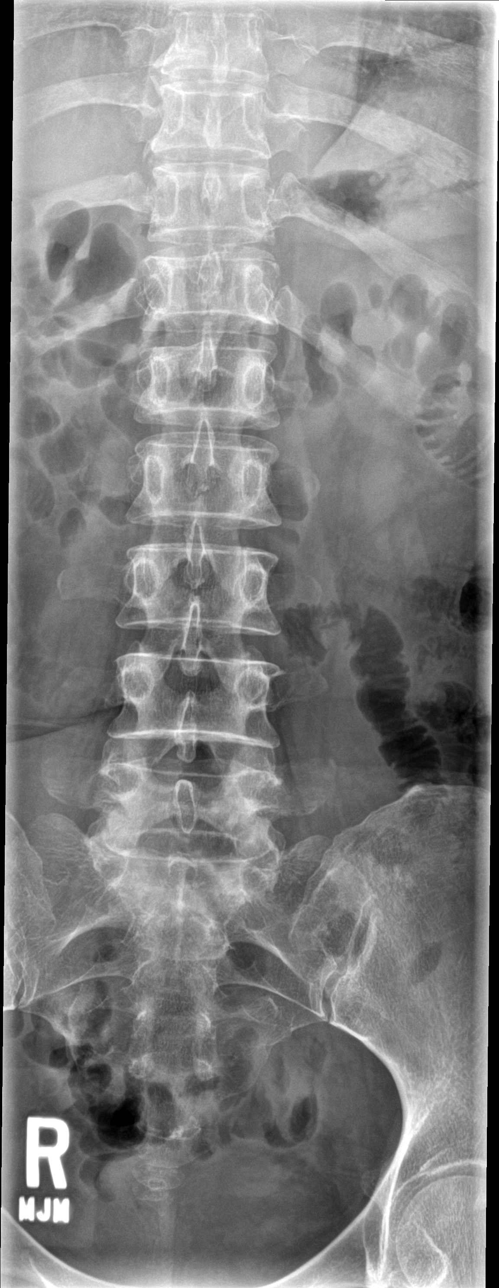

[l-spine obl (1 of 2)]
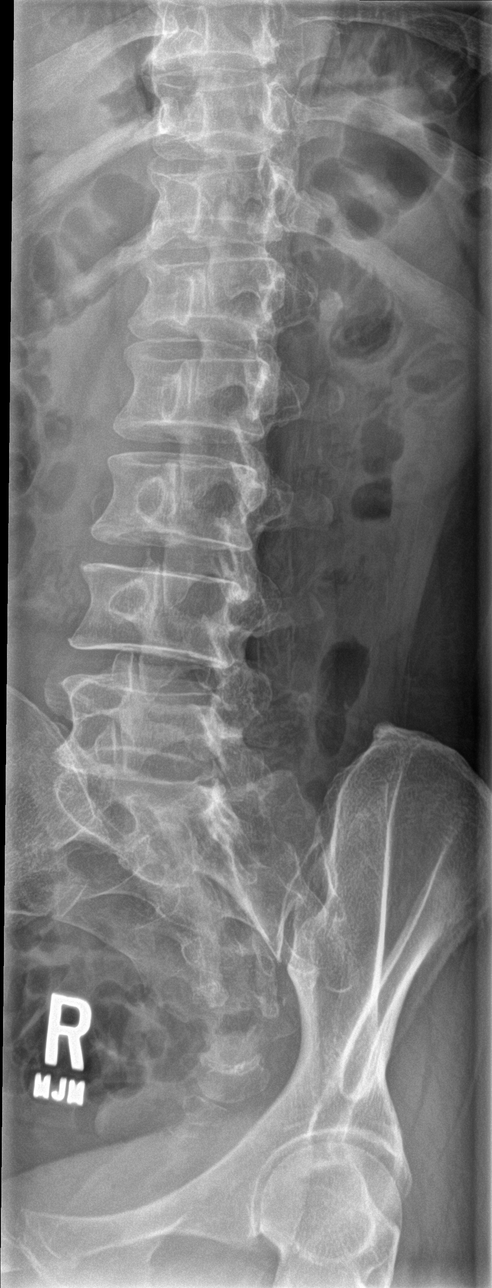

[l-spine obl (2 of 2)]
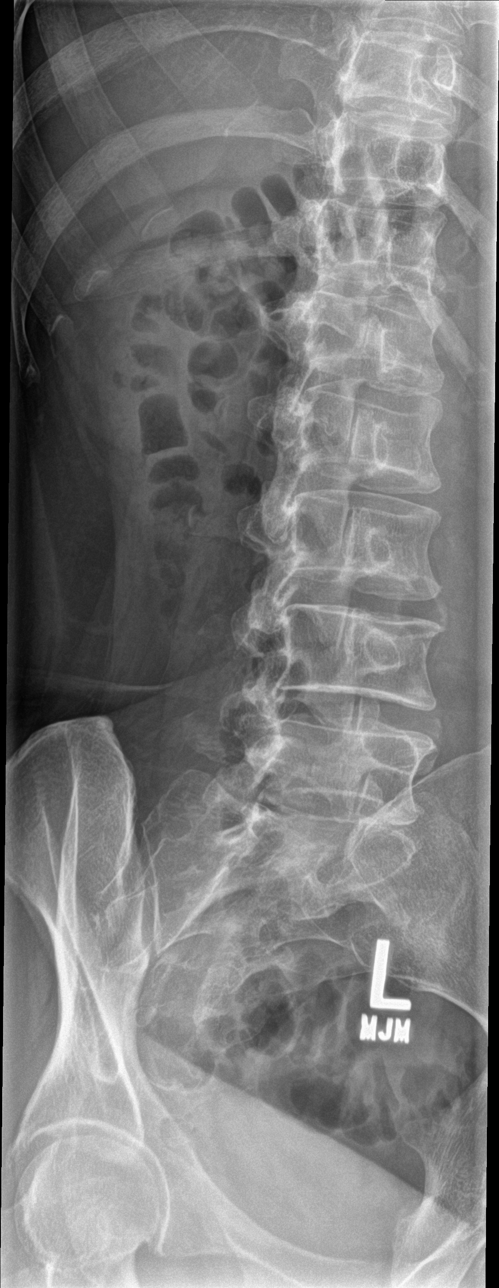

[l-spine lat]
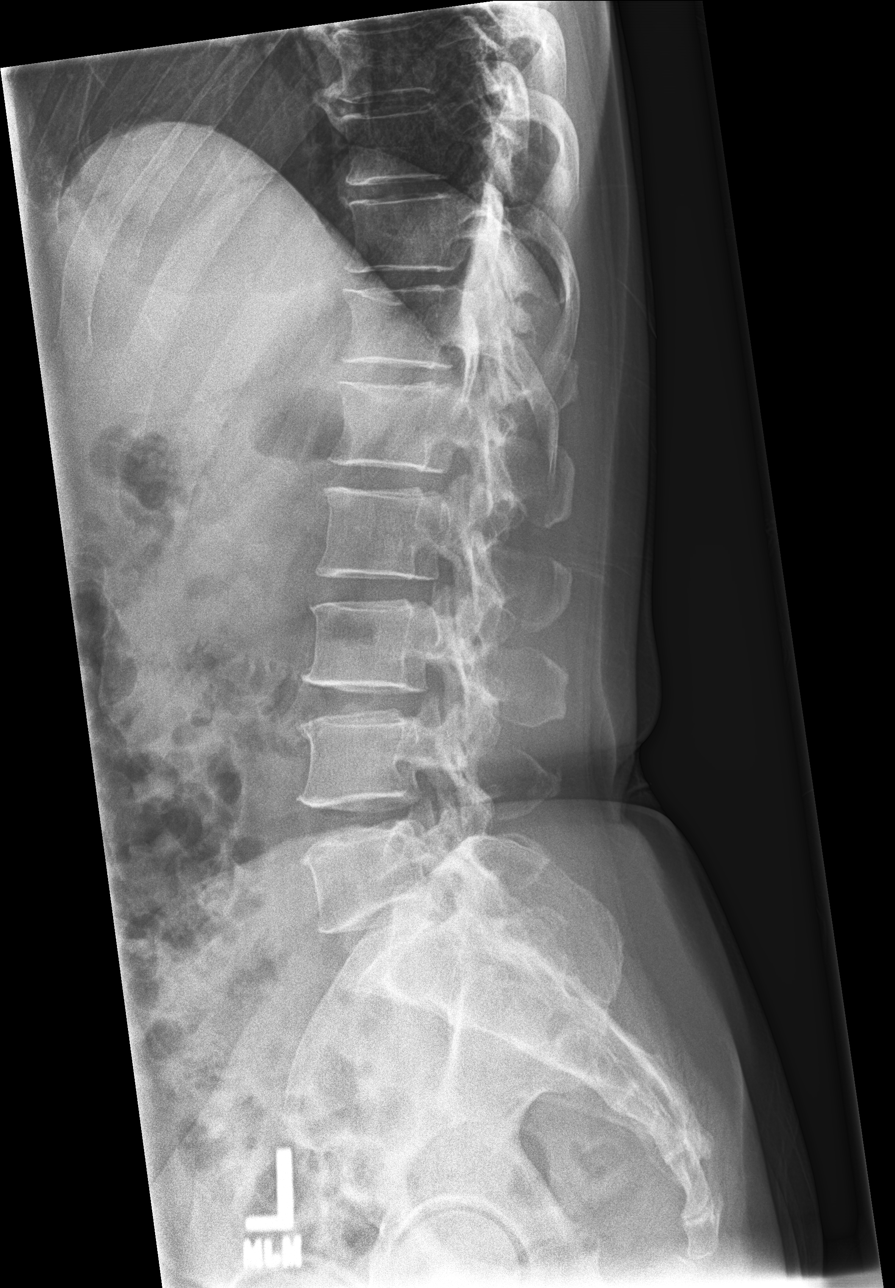

[l-spine l5-s1]
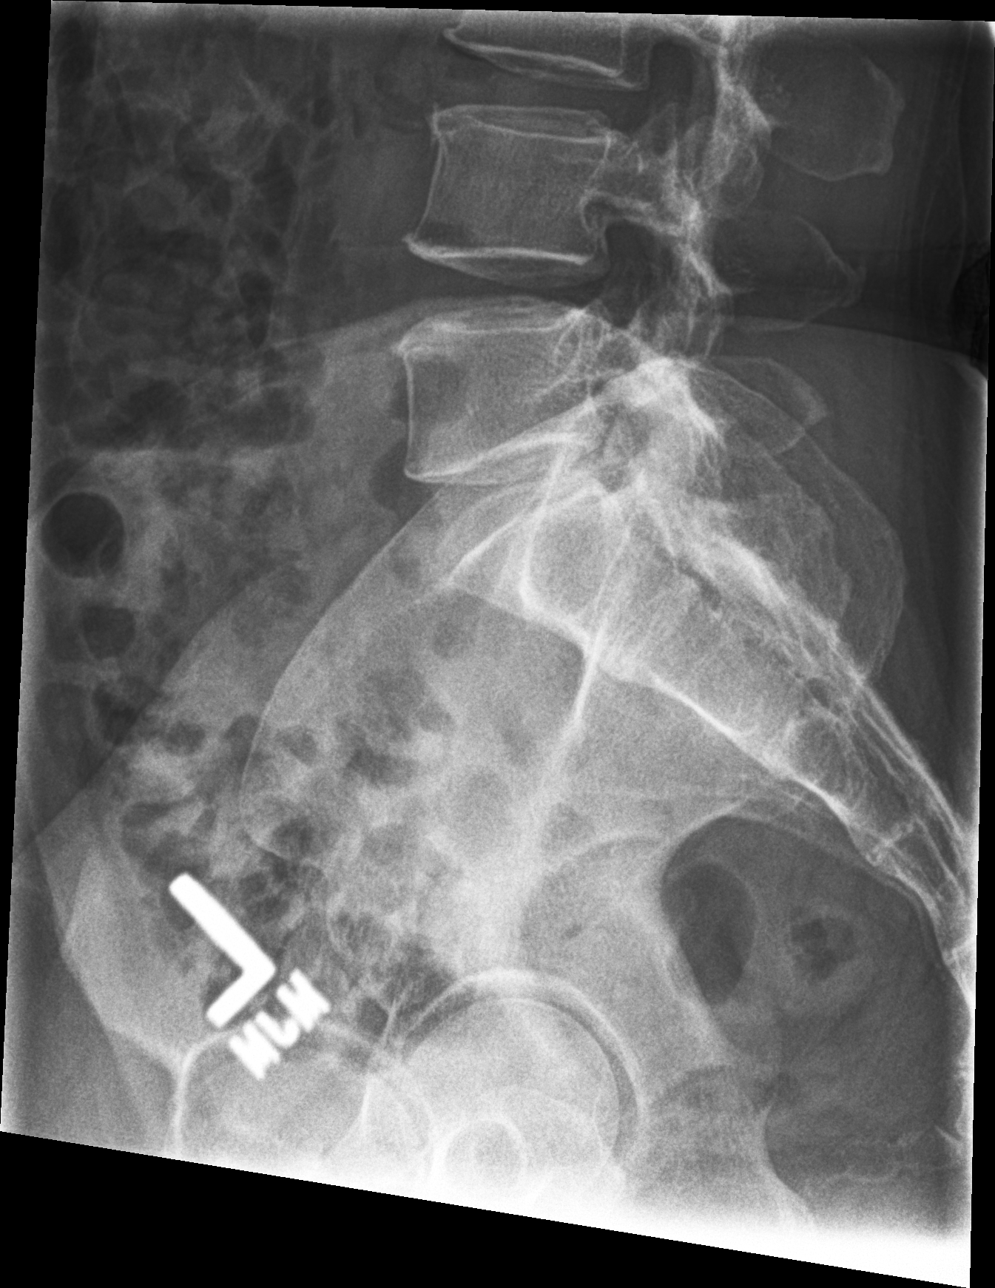

[5 of 5 positions shown; findings below may reference images not displayed]

FINDINGS: The lumbar vertebral bodies are preserved in height. The disc space
heights are well maintained. There is no spondylolisthesis. No
significant facet joint hypertrophy is observed. The pedicles and
transverse processes are intact. The observed portions of the sacrum
are normal.
IMPRESSION: There is no acute or significant chronic bony abnormality of the
lumbar spine.

## 2016-08-29 IMAGING — DX DG CERVICAL SPINE 2 OR 3 VIEWS
2 series · 2 of 2 positions shown · non-contrast
Comparison: None.

CLINICAL DATA: Neck pain causing headache. Arm weakness and
numbness for 4 days.

EXAM:
CERVICAL SPINE - 2-3 VIEW

[c-spine ap]
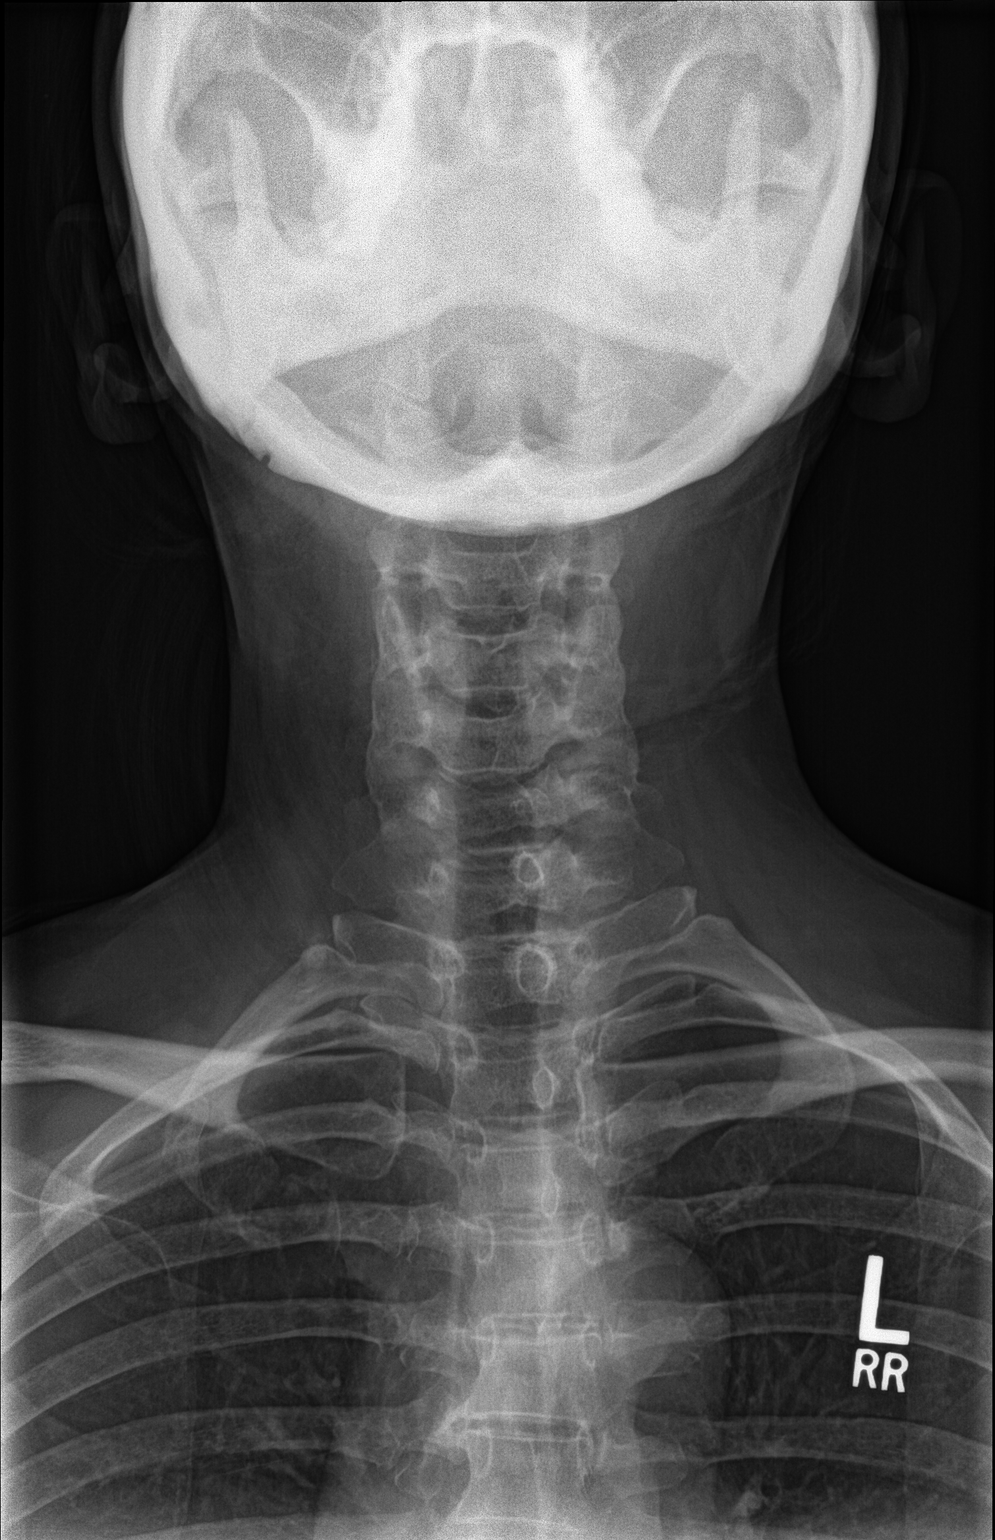

[c-spine open mouth]
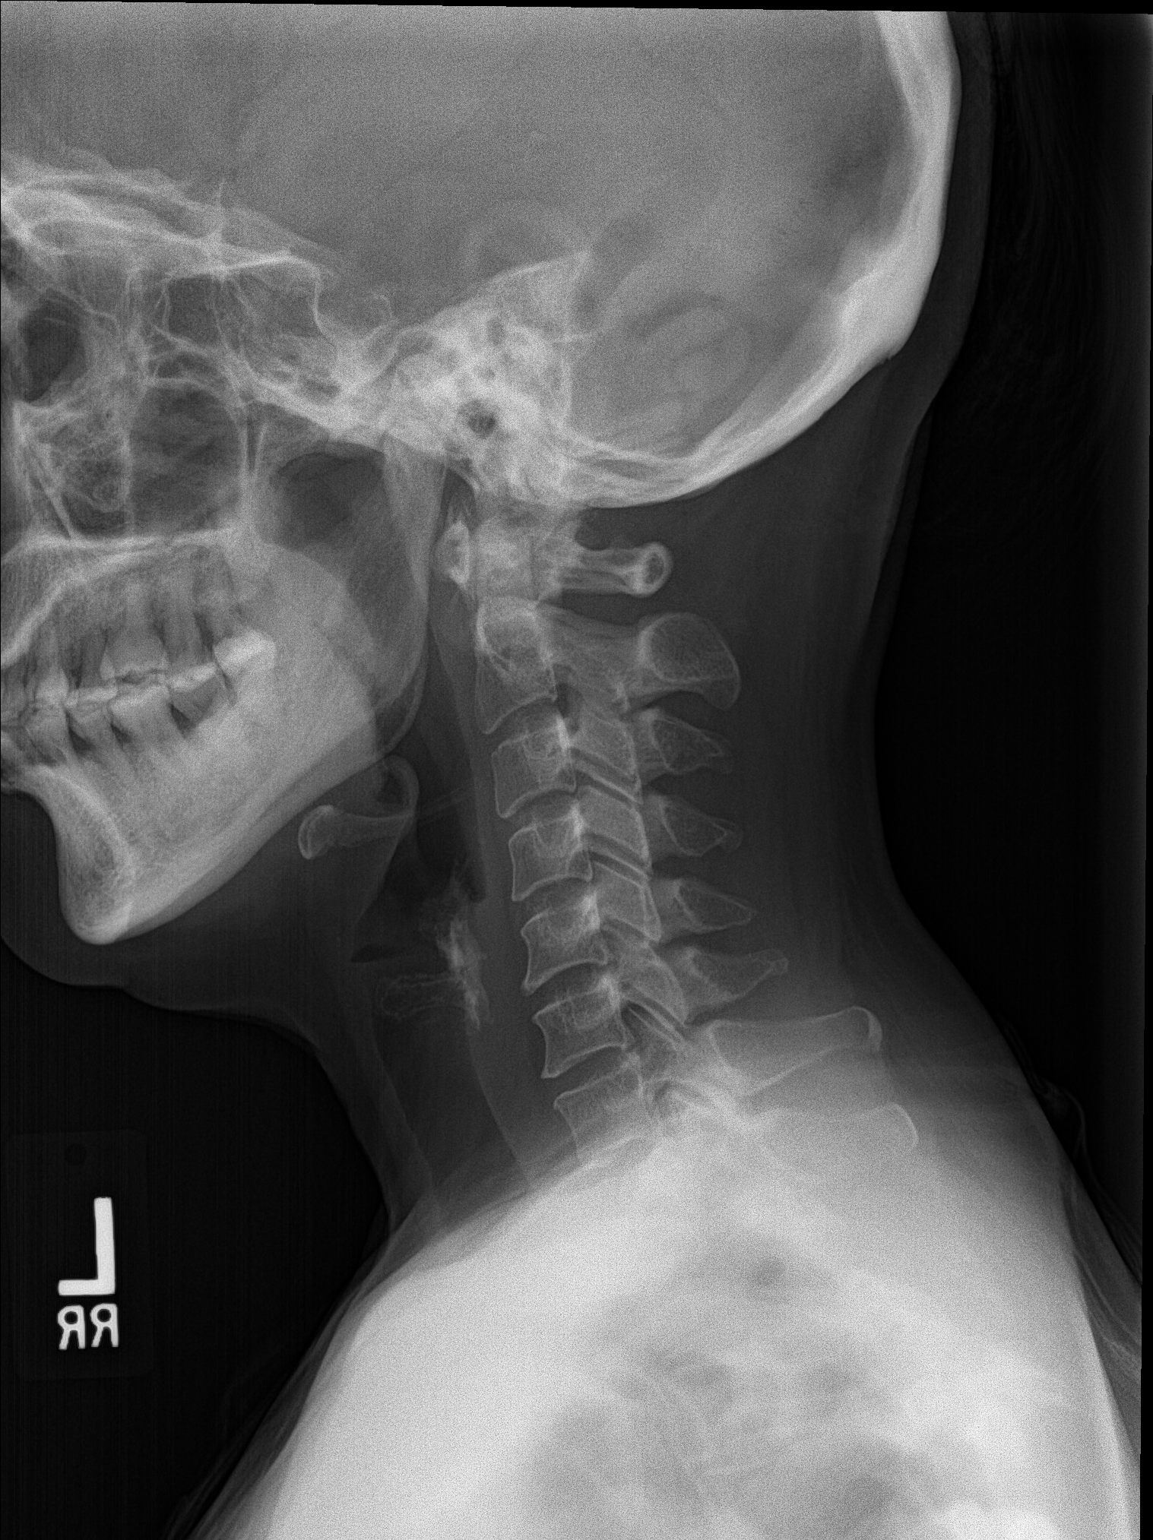

[2 of 2 positions shown; findings below may reference images not displayed]

FINDINGS: There is no evidence of cervical spine fracture or prevertebral soft
tissue swelling. Alignment is normal. No other significant bone
abnormalities are identified.
IMPRESSION: Negative cervical spine radiographs.

## 2017-07-25 ENCOUNTER — Encounter: Payer: Self-pay | Admitting: Family Medicine

## 2019-12-14 ENCOUNTER — Other Ambulatory Visit: Payer: Self-pay | Admitting: Obstetrics & Gynecology

## 2019-12-14 DIAGNOSIS — R928 Other abnormal and inconclusive findings on diagnostic imaging of breast: Secondary | ICD-10-CM

## 2019-12-28 ENCOUNTER — Ambulatory Visit
Admission: RE | Admit: 2019-12-28 | Discharge: 2019-12-28 | Disposition: A | Discharge status: Home / Self Care | Payer: BLUE CROSS/BLUE SHIELD | Source: Ambulatory Visit | Attending: Obstetrics & Gynecology | Admitting: Obstetrics & Gynecology

## 2019-12-28 ENCOUNTER — Other Ambulatory Visit: Payer: Self-pay | Admitting: Obstetrics & Gynecology

## 2019-12-28 ENCOUNTER — Other Ambulatory Visit: Payer: Self-pay

## 2019-12-28 DIAGNOSIS — R928 Other abnormal and inconclusive findings on diagnostic imaging of breast: Secondary | ICD-10-CM

## 2020-06-30 ENCOUNTER — Ambulatory Visit
Admission: RE | Admit: 2020-06-30 | Discharge: 2020-06-30 | Disposition: A | Discharge status: Home / Self Care | Payer: BC Managed Care – PPO | Source: Ambulatory Visit | Attending: Obstetrics & Gynecology | Admitting: Obstetrics & Gynecology

## 2020-06-30 ENCOUNTER — Other Ambulatory Visit: Payer: Self-pay

## 2020-06-30 DIAGNOSIS — R928 Other abnormal and inconclusive findings on diagnostic imaging of breast: Secondary | ICD-10-CM
